# Patient Record
Sex: Female | Born: 1975
Health system: Southern US, Community
[De-identification: ages and names within clinical notes are randomized; demographics above are authoritative.]

## PROBLEM LIST (undated history)

## (undated) DIAGNOSIS — R638 Other symptoms and signs concerning food and fluid intake: Secondary | ICD-10-CM

## (undated) DIAGNOSIS — E785 Hyperlipidemia, unspecified: Secondary | ICD-10-CM

## (undated) DIAGNOSIS — F53 Postpartum depression: Secondary | ICD-10-CM

## (undated) DIAGNOSIS — I10 Essential (primary) hypertension: Secondary | ICD-10-CM

## (undated) DIAGNOSIS — N952 Postmenopausal atrophic vaginitis: Secondary | ICD-10-CM

## (undated) DIAGNOSIS — O99345 Other mental disorders complicating the puerperium: Secondary | ICD-10-CM

## (undated) DIAGNOSIS — O139 Gestational [pregnancy-induced] hypertension without significant proteinuria, unspecified trimester: Secondary | ICD-10-CM

## (undated) HISTORY — DX: Postpartum depression: F53.0

## (undated) HISTORY — DX: Essential (primary) hypertension: I10

## (undated) HISTORY — DX: Postmenopausal atrophic vaginitis: N95.2

## (undated) HISTORY — PX: CHOLECYSTECTOMY: SHX55

## (undated) HISTORY — DX: Other symptoms and signs concerning food and fluid intake: R63.8

## (undated) HISTORY — PX: WISDOM TOOTH EXTRACTION: SHX21

## (undated) HISTORY — DX: Hyperlipidemia, unspecified: E78.5

## (undated) HISTORY — DX: Gestational (pregnancy-induced) hypertension without significant proteinuria, unspecified trimester: O13.9

## (undated) HISTORY — DX: Other mental disorders complicating the puerperium: O99.345

---

## 1996-05-19 HISTORY — PX: ROTATOR CUFF REPAIR: SHX139

## 2000-12-04 ENCOUNTER — Other Ambulatory Visit: Admission: RE | Admit: 2000-12-04 | Discharge: 2000-12-04 | Payer: Self-pay | Admitting: Obstetrics and Gynecology

## 2005-12-30 ENCOUNTER — Encounter: Admission: RE | Admit: 2005-12-30 | Discharge: 2005-12-30 | Payer: Self-pay | Admitting: Gastroenterology

## 2006-01-22 ENCOUNTER — Encounter (INDEPENDENT_AMBULATORY_CARE_PROVIDER_SITE_OTHER): Payer: Self-pay | Admitting: Specialist

## 2006-01-22 ENCOUNTER — Ambulatory Visit (HOSPITAL_COMMUNITY): Admission: RE | Admit: 2006-01-22 | Discharge: 2006-01-22 | Payer: Self-pay | Admitting: General Surgery

## 2009-10-02 ENCOUNTER — Inpatient Hospital Stay (HOSPITAL_COMMUNITY): Admission: RE | Admit: 2009-10-02 | Discharge: 2009-10-05 | Payer: Self-pay | Admitting: Obstetrics and Gynecology

## 2009-10-05 ENCOUNTER — Encounter: Admission: RE | Admit: 2009-10-05 | Discharge: 2009-11-04 | Payer: Self-pay | Admitting: Obstetrics and Gynecology

## 2009-10-10 ENCOUNTER — Emergency Department (HOSPITAL_COMMUNITY): Admission: EM | Admit: 2009-10-10 | Discharge: 2009-10-11 | Payer: Self-pay | Admitting: Emergency Medicine

## 2009-11-05 ENCOUNTER — Encounter: Admission: RE | Admit: 2009-11-05 | Discharge: 2009-11-08 | Payer: Self-pay | Admitting: Obstetrics and Gynecology

## 2010-08-05 LAB — CBC
HCT: 36.5 % (ref 36.0–46.0)
Hemoglobin: 12.4 g/dL (ref 12.0–15.0)
MCHC: 34 g/dL (ref 30.0–36.0)
MCHC: 34.9 g/dL (ref 30.0–36.0)
MCV: 91.4 fL (ref 78.0–100.0)
Platelets: 155 10*3/uL (ref 150–400)
RBC: 2.67 MIL/uL — ABNORMAL LOW (ref 3.87–5.11)
RBC: 3.53 MIL/uL — ABNORMAL LOW (ref 3.87–5.11)
RDW: 14.5 % (ref 11.5–15.5)
RDW: 14.9 % (ref 11.5–15.5)
WBC: 9 10*3/uL (ref 4.0–10.5)

## 2010-08-05 LAB — COMPREHENSIVE METABOLIC PANEL
ALT: 12 U/L (ref 0–35)
Alkaline Phosphatase: 132 U/L — ABNORMAL HIGH (ref 39–117)
BUN: 6 mg/dL (ref 6–23)
CO2: 18 mEq/L — ABNORMAL LOW (ref 19–32)
Calcium: 9.1 mg/dL (ref 8.4–10.5)
GFR calc non Af Amer: >60 mL/min (ref >60)
Glucose, Bld: 105 mg/dL — ABNORMAL HIGH (ref 70–99)
Sodium: 136 mEq/L (ref 135–145)

## 2010-08-05 LAB — DIFFERENTIAL
Lymphocytes Relative: 19 % (ref 12–46)
Lymphs Abs: 1.7 10*3/uL (ref 0.7–4.0)
Monocytes Relative: 6 % (ref 3–12)
Neutro Abs: 6.5 10*3/uL (ref 1.7–7.7)
Neutrophils Relative %: 73 % (ref 43–77)

## 2010-08-05 LAB — LACTATE DEHYDROGENASE: LDH: 178 U/L (ref 94–250)

## 2010-08-05 LAB — RAPID URINE DRUG SCREEN, HOSP PERFORMED
Amphetamines: NOT DETECTED
Benzodiazepines: NOT DETECTED
Cocaine: NOT DETECTED
Opiates: NOT DETECTED
Tetrahydrocannabinol: NOT DETECTED

## 2010-08-05 LAB — BASIC METABOLIC PANEL
Calcium: 9.1 mg/dL (ref 8.4–10.5)
Creatinine, Ser: 0.68 mg/dL (ref 0.4–1.2)
GFR calc Af Amer: >60 mL/min (ref >60)
Sodium: 142 mEq/L (ref 135–145)

## 2010-08-05 LAB — ETHANOL: Alcohol, Ethyl (B): <5 mg/dL (ref 0–10)

## 2010-08-05 LAB — URIC ACID: Uric Acid, Serum: 4.9 mg/dL (ref 2.4–7.0)

## 2010-10-04 NOTE — Op Note (Signed)
Courtney Ali, Courtney Ali            ACCOUNT NO.:  [id number]   MEDICAL RECORD NO.:  [id number]          PATIENT TYPE:  AMB   LOCATION:  SDS                          FACILITY:  [facility]   PHYSICIAN:  Cherylynn Ridges, M.D.    DATE OF BIRTH:  May 02, 1976   DATE OF PROCEDURE:  01/22/2006  DATE OF DISCHARGE:                                 OPERATIVE REPORT   PREOPERATIVE DIAGNOSIS:  Symptomatic cholelithiasis.   POSTOPERATIVE DIAGNOSIS:  Symptomatic cholelithiasis.   PROCEDURE:  Laparoscopic cholecystectomy with cholangiogram.   SURGEON:  Cherylynn Ridges, M.D.  No assistant.   ANESTHESIA:  General endotracheal.   ESTIMATED BLOOD LOSS:  Less than 20 mL.   COMPLICATIONS:  None.   CONDITION:  Stable.   INDICATIONS FOR OPERATION:  The patient is a 35 year old with abdominal pain  in the right upper quadrant and gallstones noted on ultrasound, who comes in  for a laparoscopic cholecystectomy.   FINDINGS:  The patient had some evidence of past inflammation with some  omental adhesions to the gallbladder.  Cholangiogram was normal with small  ducts and no evidence of obstruction.  She had multiple stones in the  gallbladder.   OPERATION:  The patient was taken to the operating room and placed on the  table in supine position.  After an adequate endotracheal anesthetic was  administered, she was prepped and draped in the usual sterile manner  exposing the midline and the right upper quadrant.   An infraumbilical longitudinal incision made using a #11 blade and taken  down to the midline fascia.  This fascia was grasped with a Kocher clamp and  then an incision made between two Kochers into the fascia and preperitoneal  space.  We bluntly dissected down through the peritoneum into the peritoneal  cavity using a Kelly clamp while pulling up on the Kocher clamps.  Once we  had entered the free peritoneal space, a pursestring suture of 0 Vicryl was  passed around the fascial opening to secure  the Hasson cannula that was  passed subsequently into the peritoneal cavity.  Once the Hasson cannula was  secured in place, carbon dioxide gas was instilled through the Hasson  cannula into the peritoneal cavity up to maximal intra-abdominal pressure of  15 mmHg.   The patient was placed reversed Trendelenburg.  Two right costal margin 5-mm  cannulas were passed under direct vision and a subxiphoid 11/12 mm cannula  passed under direct vision.  With the left side tilted down in reversed  Trendelenburg, the dissection was begun.   The patient had some omental adhesions to the body of the gallbladder, which  were bluntly dissected away.  The gallbladder itself was somewhat floppy.  We were able to retract it toward the right upper quadrant and the right-  sided abdominal wall with a ratcheted grasper through the lateral-most 5-mm  cannula.  A second grasper was passed onto the infundibulum.  We able to  dissect out the peritoneum overlying the triangle of Calot and the  hepatoduodenal triangle with minimal difficulty.  The cystic artery ran the  external to the triangle  of Calot along with the cystic duct, and we able to  cauterize it as it was a very small structure.  We dissected out the cystic  duct and got an adequate window between the cystic duct and the common bile  duct.  A clip was placed on the gallbladder side of the cystic duct and then  a cholecystodochotomy made using laparoscopic scissors.  It was through this  cholecystodochotomy that a Cook catheter was passed and a cholangiogram  done, which showed good flow into the duodenum, no obstruction, no filling  defects, and good proximal flow.  A copy of the cholangiogram was left on  the patient's chart.   Once the cholangiogram was completed, we removed the clip securing the  catheter in place, removed the [facility] catheter and then doubly clipped this  the distal to the cystic duct using the Endoclose.  We then transected the   cystic duct and then dissected out the gallbladder from its bed with minimal  difficulty.  We used an EndoCatch bag to pull the gallbladder out from the  infraumbilical site with minimal difficulty.  All counts were correct.  We  irrigated with a small amount of saline solution, obtained adequate  hemostasis using electrocautery, and then closed.   The pursestring suture in the infraumbilical position was used to close the  fascial incision at that side and then the skin was closed at all sites  using running subcuticular stitch of 4-0 Vicryl.  Marcaine 0.25% with  epinephrine was injected prior to closure of skin and then sterile dressings  applied.  All counts were correct as mentioned previously.      Cherylynn Ridges, M.D.  Electronically Signed     JOW/MEDQ  D:  01/22/2006  T:  01/22/2006  Job:  [id number]

## 2012-04-18 HISTORY — PX: OTHER SURGICAL HISTORY: SHX169

## 2012-08-21 ENCOUNTER — Emergency Department: Payer: Self-pay | Admitting: Emergency Medicine

## 2012-08-21 LAB — URINALYSIS, COMPLETE
Bilirubin,UR: NEGATIVE
Hyaline Cast: 1
Nitrite: NEGATIVE
Specific Gravity: 1.013 (ref 1.003–1.030)
WBC UR: 12 /HPF (ref 0–5)

## 2012-08-21 LAB — BASIC METABOLIC PANEL
Anion Gap: 6 — ABNORMAL LOW (ref 7–16)
BUN: 5 mg/dL — ABNORMAL LOW (ref 7–18)
Calcium, Total: 8.8 mg/dL (ref 8.5–10.1)
Chloride: 106 mmol/L (ref 98–107)
Co2: 25 mmol/L (ref 21–32)
Creatinine: 0.54 mg/dL — ABNORMAL LOW (ref 0.60–1.30)
EGFR (African American): >60
EGFR (Non-African Amer.): >60
Sodium: 137 mmol/L (ref 136–145)

## 2012-08-21 LAB — CBC
HCT: 36.9 % (ref 35.0–47.0)
MCV: 87 fL (ref 80–100)
Platelet: 237 10*3/uL (ref 150–440)
WBC: 11.4 10*3/uL — ABNORMAL HIGH (ref 3.6–11.0)

## 2012-12-06 ENCOUNTER — Observation Stay: Payer: Self-pay | Admitting: Obstetrics and Gynecology

## 2012-12-09 ENCOUNTER — Observation Stay: Payer: Self-pay | Admitting: Obstetrics and Gynecology

## 2012-12-13 ENCOUNTER — Observation Stay: Payer: Self-pay | Admitting: Obstetrics and Gynecology

## 2012-12-20 ENCOUNTER — Observation Stay: Payer: Self-pay | Admitting: Obstetrics and Gynecology

## 2012-12-23 ENCOUNTER — Observation Stay: Payer: Self-pay | Admitting: Obstetrics and Gynecology

## 2012-12-27 ENCOUNTER — Observation Stay: Payer: Self-pay | Admitting: Obstetrics and Gynecology

## 2012-12-30 ENCOUNTER — Observation Stay: Payer: Self-pay | Admitting: Obstetrics and Gynecology

## 2013-01-03 ENCOUNTER — Observation Stay: Payer: Self-pay | Admitting: Obstetrics and Gynecology

## 2013-01-06 ENCOUNTER — Inpatient Hospital Stay: Payer: Self-pay | Admitting: Obstetrics and Gynecology

## 2013-01-06 LAB — PROTEIN / CREATININE RATIO, URINE
Creatinine, Urine: 193.2 mg/dL — ABNORMAL HIGH (ref 30.0–125.0)
Protein, Random Urine: 59 mg/dL — ABNORMAL HIGH (ref 0–12)

## 2013-01-06 LAB — PIH PROFILE
Anion Gap: 4 — ABNORMAL LOW (ref 7–16)
BUN: 6 mg/dL — ABNORMAL LOW (ref 7–18)
Calcium, Total: 8.6 mg/dL (ref 8.5–10.1)
Co2: 26 mmol/L (ref 21–32)
Creatinine: 0.5 mg/dL — ABNORMAL LOW (ref 0.60–1.30)
HCT: 30.5 % — ABNORMAL LOW (ref 35.0–47.0)
HGB: 10.4 g/dL — ABNORMAL LOW (ref 12.0–16.0)
MCH: 28.8 pg (ref 26.0–34.0)
MCHC: 34.2 g/dL (ref 32.0–36.0)
RDW: 14.5 % (ref 11.5–14.5)
Sodium: 139 mmol/L (ref 136–145)
Uric Acid: 4.1 mg/dL (ref 2.6–6.0)
WBC: 7.5 10*3/uL (ref 3.6–11.0)

## 2013-01-07 LAB — PIH PROFILE
BUN: 5 mg/dL — ABNORMAL LOW (ref 7–18)
Calcium, Total: 7.7 mg/dL — ABNORMAL LOW (ref 8.5–10.1)
Co2: 26 mmol/L (ref 21–32)
Creatinine: 0.6 mg/dL (ref 0.60–1.30)
EGFR (African American): >60
HGB: 10.4 g/dL — ABNORMAL LOW (ref 12.0–16.0)
Potassium: 3.9 mmol/L (ref 3.5–5.1)
RDW: 14.7 % — ABNORMAL HIGH (ref 11.5–14.5)
SGOT(AST): 18 U/L (ref 15–37)
Uric Acid: 3.9 mg/dL (ref 2.6–6.0)

## 2013-01-07 LAB — HEMATOCRIT: HCT: 29.6 % — ABNORMAL LOW (ref 35.0–47.0)

## 2013-01-26 LAB — PATHOLOGY REPORT

## 2013-08-23 LAB — HM PAP SMEAR: HM PAP: NEGATIVE

## 2014-09-26 NOTE — H&P (Signed)
L&D Evaluation:  History:  HPI 39 yo mwf G2P1001 at 36.2 weeks admitted with Mild Pre Eclampsia in Di/DI Vtx/Br Twin gestation.   Presents with Mild Pre Eclampsia   Patient's Medical History No Chronic Illness  PP Depression; UTI in Pregnancy; Anemia   Patient's Surgical History none   Medications Pre Natal Vitamins  Iron   Allergies NKDA   Social History none   Family History Non-Contributory   ROS:  ROS All systems were reviewed.  HEENT, CNS, GI, GU, Respiratory, CV, Renal and Musculoskeletal systems were found to be normal.   Exam:  Vital Signs BP >140/90   Urine Protein 1+   General no apparent distress   Mental Status clear   Heart normal sinus rhythm   Abdomen gravid, non-tender   Estimated Fetal Weight Average for gestational age   Edema 1+   Reflexes 3+   Clonus negative   Pelvic 3/80/-2/vtx/bowi   Mebranes Intact   FHT normal rate with no decels   Skin dry   Lymph no lymphadenopathy   Other O=/Atb-/NR/RI/VI/HB-/HIV-/GBS- Urine P:C ratio 305; Platelets 143k   Impression:  Impression 36.2 week Twins; Mild Pre Eclampsia   Plan:  Plan Pitocin IOL; Magnesium prophylaxis; Epidural   Comments Anticipate Vaginal delivery.   Electronic Signatures: Tarrence Enck, Prentice DockerMartin A (MD)  (Signed 21-Aug-14 13:50)  Authored: L&D Evaluation   Last Updated: 21-Aug-14 13:50 by Derik Fults, Prentice DockerMartin A (MD)

## 2014-09-26 NOTE — H&P (Signed)
L&D Evaluation:  History Expanded:  HPI late entry99- 39 yo at 3833 weeks for NST of DI/DI twins, AMA, INcreased BMI h/o fetal macrosomia 9#4oz   Gravida 2   Term 1   PreTerm 0   Abortion 0   Living 1   Blood Type (Maternal) O positive   Group B Strep Results Maternal (Result >5wks must be treated as unknown) unknown/result > 5 weeks ago   Maternal HIV Negative   Maternal Syphilis Ab Nonreactive   Maternal Varicella Immune   Rubella Results (Maternal) immune   Maternal T-Dap Unknown   [facility]   Presents with TWINS for NST   Patient's Medical History AMA. DI DI Twins, Increasaed BMI   Patient's Surgical History none   Medications Pre Natal Vitamins   Allergies NKDA   Social History none   Family History Non-Contributory   Current Prenatal Course Notable For Multiple Gestation   ROS:  ROS All systems were reviewed.  HEENT, CNS, GI, GU, Respiratory, CV, Renal and Musculoskeletal systems were found to be normal.   Exam:  Vital Signs stable   Urine Protein not completed   General no apparent distress   Mental Status clear   Chest clear   Heart normal sinus rhythm   Abdomen gravid, non-tender   Estimated Fetal Weight Large for gestational age   Back no CVAT   Pelvic no external lesions   Mebranes Intact   Other both twins strictly reactive no decels, cat 1   Impression:  Impression reactive NST   Plan:  Comments follow up weekly. labopr precautions given   Electronic Signatures: Adria DevonKlett, Anselm Aumiller (MD)  (Signed 30-Jul-14 21:22)  Authored: L&D Evaluation   Last Updated: 30-Jul-14 21:22 by Adria DevonKlett, Laakea Pereira (MD)

## 2014-12-18 ENCOUNTER — Telehealth: Payer: Self-pay

## 2014-12-18 DIAGNOSIS — E7800 Pure hypercholesterolemia, unspecified: Secondary | ICD-10-CM

## 2014-12-18 NOTE — Telephone Encounter (Signed)
Pt notified of need of 6 month f/u Lipid panel and she should be fasting.

## 2014-12-18 NOTE — Telephone Encounter (Signed)
-----   Message from Myrtis Hopping sent at 11/06/2014  5:25 PM EDT ----- Regarding: labs Sent: 12/18/2014  Historical Summary: 08/21/2014  Reminder for patient: Courtney Ali  Per mad pt needs to repeat lipid panel in 6 months. cm

## 2014-12-25 NOTE — Telephone Encounter (Addendum)
Reminder for Lipid panel needed to be done. (Chart previously opened)

## 2014-12-25 NOTE — Telephone Encounter (Signed)
Left message to remind pt to have Lipid panel done.

## 2014-12-26 ENCOUNTER — Other Ambulatory Visit: Payer: 59

## 2014-12-26 DIAGNOSIS — E7800 Pure hypercholesterolemia, unspecified: Secondary | ICD-10-CM

## 2014-12-27 LAB — LIPID PANEL
CHOLESTEROL TOTAL: 165 mg/dL (ref 100–199)
Chol/HDL Ratio: 3.6 ratio units (ref 0.0–4.4)
HDL: 46 mg/dL (ref >39)
LDL CALC: 96 mg/dL (ref 0–99)
Triglycerides: 115 mg/dL (ref 0–149)
VLDL CHOLESTEROL CAL: 23 mg/dL (ref 5–40)

## 2015-03-27 ENCOUNTER — Encounter: Payer: 59 | Admitting: Obstetrics and Gynecology

## 2015-03-28 ENCOUNTER — Encounter: Payer: Self-pay | Admitting: Obstetrics and Gynecology

## 2015-03-28 ENCOUNTER — Ambulatory Visit (INDEPENDENT_AMBULATORY_CARE_PROVIDER_SITE_OTHER): Payer: 59 | Admitting: Obstetrics and Gynecology

## 2015-03-28 VITALS — BP 142/82 | HR 69 | Ht 68.6 in | Wt 214.0 lb

## 2015-03-28 DIAGNOSIS — E785 Hyperlipidemia, unspecified: Secondary | ICD-10-CM | POA: Diagnosis not present

## 2015-03-28 DIAGNOSIS — R638 Other symptoms and signs concerning food and fluid intake: Secondary | ICD-10-CM

## 2015-03-28 DIAGNOSIS — Z01419 Encounter for gynecological examination (general) (routine) without abnormal findings: Secondary | ICD-10-CM

## 2015-03-28 DIAGNOSIS — E7800 Pure hypercholesterolemia, unspecified: Secondary | ICD-10-CM | POA: Diagnosis not present

## 2015-03-28 DIAGNOSIS — Z8759 Personal history of other complications of pregnancy, childbirth and the puerperium: Secondary | ICD-10-CM | POA: Diagnosis not present

## 2015-03-28 DIAGNOSIS — F419 Anxiety disorder, unspecified: Secondary | ICD-10-CM

## 2015-03-28 DIAGNOSIS — N644 Mastodynia: Secondary | ICD-10-CM

## 2015-03-28 NOTE — Addendum Note (Signed)
Addended by: Sharon SellerEFRANCESCO, MARTIN on: 03/28/2015 11:34 AM   Modules accepted: Orders

## 2015-03-28 NOTE — Patient Instructions (Signed)
1.  Pap smear obtained. 2.  Diagnostic mammogram is ordered for evaluation of left breast pain, upper outer quadrant. 3.  Continue with healthy eating and exercise.  Periodically check blood pressure. 4.  Screening labs will be deferred until next year. 5.  Mirena IUD string check is normal. 6.  Return in 1 year.

## 2015-03-28 NOTE — Progress Notes (Signed)
Patient ID: Courtney Ali, female   DOB: 12-Jan-1976, 39 y.o.   MRN: [id number] ANNUAL PREVENTATIVE CARE GYN  ENCOUNTER NOTE  Subjective:       Courtney Ali is a 39 y.o. 352P2003 female here for a routine annual gynecologic exam.  Current complaints: 1.  Left breast pain on uoq near cycle  Recent cholesterol evaluation notable for total cholesterol of 165.  Patient is doing cross fit 3 times a week and rowing twice a week.  She feels as if a void as been filled in her day-to-day life.  Relationship is good with husband.  Children doing well.   Gynecologic History Patient's last menstrual period was 01/18/2015 (approximate). Contraception: IUD Last Pap: 08/23/2014 reflex- neg. Results were: abnormal Last mammogram: n/a. Results were: n/a  Obstetric History OB History  Gravida Para Term Preterm AB SAB TAB Ectopic Multiple Living  2 2 2      1 3     # Outcome Date GA Lbr Len/2nd Weight Sex Delivery Anes PTL Lv  2A Term     F Vag-Spont   Y  2B Term     F Vag-Spont   Y  1 Term    9 lb 9.6 oz (4.355 kg) F Vag-Spont   Y      Past Medical History  Diagnosis Date  . Hypertension   . PIH (pregnancy induced hypertension)   . Increased BMI   . Post partum depression   . Vaginal atrophy     breastfeeding induced    Past Surgical History  Procedure Laterality Date  . Artificial inseminaiton  04/2012    Current Outpatient Prescriptions on File Prior to Visit  Medication Sig Dispense Refill  . levonorgestrel (MIRENA) 20 MCG/24HR IUD 1 each by Intrauterine route once.     No current facility-administered medications on file prior to visit.    No Known Allergies  Social History   Social History  . Marital Status: Married    Spouse Name: N/A  . Number of Children: N/A  . Years of Education: N/A   Occupational History  . Not on file.   Social History Main Topics  . Smoking status: Never Smoker   . Smokeless tobacco: Not on file  . Alcohol Use: Yes     Comment:  socially  . Drug Use: No  . Sexual Activity: Yes    Birth Control/ Protection: IUD   Other Topics Concern  . Not on file   Social History Narrative    Family History  Problem Relation Age of Onset  . Diabetes Paternal Grandmother   . Heart disease Neg Hx   . Breast cancer Neg Hx   . Colon cancer Neg Hx   . Ovarian cancer Neg Hx   . Cancer Neg Hx     The following portions of the patient's history were reviewed and updated as appropriate: allergies, current medications, past family history, past medical history, past social history, past surgical history and problem list.  Review of Systems ROS Review of Systems - General ROS: negative for - chills, fatigue, fever, hot flashes, night sweats, weight gain or weight loss Psychological ROS: negative for - anxiety, decreased libido, depression, mood swings, physical abuse or sexual abuse Ophthalmic ROS: negative for - blurry vision, eye pain or loss of vision ENT ROS: negative for - headaches, hearing change, visual changes or vocal changes Allergy and Immunology ROS: negative for - hives, itchy/watery eyes or seasonal allergies Hematological and Lymphatic ROS: negative for -  bleeding problems, bruising, swollen lymph nodes or weight loss Endocrine ROS: negative for - galactorrhea, hair pattern changes, hot flashes, malaise/lethargy, mood swings, palpitations, polydipsia/polyuria, skin changes, temperature intolerance or unexpected weight changes Breast ROS: negative for - new or changing breast lumps or nipple discharge; POSITIVE-left breast, upper outer quadrant Respiratory ROS: negative for - cough or shortness of breath Cardiovascular ROS: negative for - chest pain, irregular heartbeat, palpitations or shortness of breath Gastrointestinal ROS: no abdominal pain, change in bowel habits, or black or bloody stools Genito-Urinary ROS: no dysuria, trouble voiding, or hematuria Musculoskeletal ROS: negative for - joint pain or joint  stiffness Neurological ROS: negative for - bowel and bladder control changes Dermatological ROS: negative for rash and skin lesion changes   Objective:   BP 142/82 mmHg  Pulse 69  Ht 5' 8.6" (1.742 m)  Wt 214 lb (97.07 kg)  BMI 31.99 kg/m2  LMP 01/18/2015 (Approximate) CONSTITUTIONAL: Well-developed, well-nourished female in no acute distress.  PSYCHIATRIC: Normal mood and affect. Normal behavior. Normal judgment and thought content. NEUROLGIC: Alert and oriented to person, place, and time. Normal muscle tone coordination. No cranial nerve deficit noted. HENT:  Normocephalic, atraumatic, External right and left ear normal. Oropharynx is clear and moist EYES: Conjunctivae and EOM are normal. Pupils are equal, round, and reactive to light. No scleral icterus.  NECK: Normal range of motion, supple, no masses.  Normal thyroid.  SKIN: Skin is warm and dry. No rash noted. Not diaphoretic. No erythema. No pallor. CARDIOVASCULAR: Normal heart rate noted, regular rhythm, no murmur. RESPIRATORY: Clear to auscultation bilaterally. Effort and breath sounds normal, no problems with respiration noted. BREASTS: Symmetric in size. No masses, skin changes, nipple drainage, or lymphadenopathy.no palpable abnormality in left breast. ABDOMEN: Soft, normal bowel sounds, no distention noted.  No tenderness, rebound or guarding.  BLADDER: Normal PELVIC:  External Genitalia: Normal  BUS: Normal  Vagina: Normal  Cervix: Normal; IUD string 3 cm visualized  Uterus: Normal; midplane, normal size and shape, mobile  Adnexa: Normal  RV: External Exam NormaI  MUSCULOSKELETAL: Normal range of motion. No tenderness.  No cyanosis, clubbing, or edema.  2+ distal pulses. LYMPHATIC: No Axillary, Supraclavicular, or Inguinal Adenopathy.    Assessment:   Annual gynecologic examination 39 y.o. Contraception: IUD Obesity 1; excellent fitness at this time with cross fit and rowing wellness program Left breast pain,  unclear etiology; fibrocystic change On exam History of hyperlipidemia; most recent cholesterol 165  Plan:  Pap: Pap Co Test Mammogram: diagnostic mammogram is ordered Stool Guaiac Testing:  Not Indicated Labs: fbs vit d lipid a1c  Routine preventative health maintenance measures emphasized: Exercise/Diet/Weight control, Tobacco Warnings, Alcohol/Substance use risks and Stress Management  Return to Clinic - 1 Year   Crystal Webster, CMA  Herold Harms, MD  Note: This dictation was prepared with Dragon dictation along with smaller phrase technology. Any transcriptional errors that result from this process are unintentional.

## 2015-04-02 LAB — PAP IG AND HPV HIGH-RISK: PAP SMEAR COMMENT: 0

## 2016-03-31 NOTE — Progress Notes (Signed)
Patient ID: Courtney Ali, female   DOB: Nov 06, 1975, 40 y.o.   MRN: [id number] ANNUAL PREVENTATIVE CARE GYN  ENCOUNTER NOTE  Subjective:       Courtney Ali is a 40 y.o. 722P2003 female here for a routine annual gynecologic exam.  Current complaints:  1. None  Patient has only spotting occasionally with Mirena IUD. No new interval health history issues. Bowel and bladder function are normal.    Gynecologic History No LMP recorded. Patient is not currently having periods (Reason: IUD). Contraception: IUD Last Pap: 03/2015 neg/neg. Results were: wnl Last mammogram: n/a. Results were: n/a  Obstetric History OB History  Gravida Para Term Preterm AB Living  2 2 2     3   SAB TAB Ectopic Multiple Live Births        1 3    # Outcome Date GA Lbr Len/2nd Weight Sex Delivery Anes PTL Lv  2A Term     F Vag-Spont   LIV  2B Term     F Vag-Spont   LIV  1 Term    9 lb 9.6 oz (4.355 kg) F Vag-Spont   LIV      Past Medical History:  Diagnosis Date  . Hypertension   . Increased BMI   . PIH (pregnancy induced hypertension)   . Post partum depression   . Vaginal atrophy    breastfeeding induced    Past Surgical History:  Procedure Laterality Date  . artificial inseminaiton  04/2012    Current Outpatient Prescriptions on File Prior to Visit  Medication Sig Dispense Refill  . levonorgestrel (MIRENA) 20 MCG/24HR IUD 1 each by Intrauterine route once.     No current facility-administered medications on file prior to visit.     No Known Allergies  Social History   Social History  . Marital status: Married    Spouse name: N/A  . Number of children: N/A  . Years of education: N/A   Occupational History  . Not on file.   Social History Main Topics  . Smoking status: Never Smoker  . Smokeless tobacco: Not on file  . Alcohol use Yes     Comment: socially  . Drug use: No  . Sexual activity: Yes    Birth control/ protection: IUD   Other Topics Concern  . Not on  file   Social History Narrative  . No narrative on file    Family History  Problem Relation Age of Onset  . Diabetes Paternal Grandmother   . Heart disease Neg Hx   . Breast cancer Neg Hx   . Colon cancer Neg Hx   . Ovarian cancer Neg Hx   . Cancer Neg Hx     The following portions of the patient's history were reviewed and updated as appropriate: allergies, current medications, past family history, past medical history, past social history, past surgical history and problem list.  Review of Systems ROS Review of Systems - General ROS: negative for - chills, fatigue, fever, hot flashes, night sweats, weight gain or weight loss Psychological ROS: negative for - anxiety, decreased libido, depression, mood swings, physical abuse or sexual abuse Ophthalmic ROS: negative for - blurry vision, eye pain or loss of vision ENT ROS: negative for - headaches, hearing change, visual changes or vocal changes Allergy and Immunology ROS: negative for - hives, itchy/watery eyes or seasonal allergies Hematological and Lymphatic ROS: negative for - bleeding problems, bruising, swollen lymph nodes or weight loss Endocrine ROS: negative for -  galactorrhea, hair pattern changes, hot flashes, malaise/lethargy, mood swings, palpitations, polydipsia/polyuria, skin changes, temperature intolerance or unexpected weight changes Breast ROS: negative for - new or changing breast lumps or nipple discharge; POSITIVE-left breast, upper outer quadrant Respiratory ROS: negative for - cough or shortness of breath Cardiovascular ROS: negative for - chest pain, irregular heartbeat, palpitations or shortness of breath Gastrointestinal ROS: no abdominal pain, change in bowel habits, or black or bloody stools Genito-Urinary ROS: no dysuria, trouble voiding, or hematuria Musculoskeletal ROS: negative for - joint pain or joint stiffness Neurological ROS: negative for - bowel and bladder control changes Dermatological ROS:  negative for rash and skin lesion changes   Objective:   BP 132/78   Pulse 71   Ht 5\' 8"  (1.727 m)   Wt 211 lb (95.7 kg)   BMI 32.08 kg/m  CONSTITUTIONAL: Well-developed, well-nourished female in no acute distress.  PSYCHIATRIC: Normal mood and affect. Normal behavior. Normal judgment and thought content. NEUROLGIC: Alert and oriented to person, place, and time. Normal muscle tone coordination. No cranial nerve deficit noted. HENT:  Normocephalic, atraumatic, External right and left ear normal. Oropharynx is clear and moist EYES: Conjunctivae and EOM are normal. Pupils are equal, round, and reactive to light. No scleral icterus.  NECK: Normal range of motion, supple, no masses.  Normal thyroid.  SKIN: Skin is warm and dry. No rash noted. Not diaphoretic. No erythema. No pallor. CARDIOVASCULAR: Normal heart rate noted, regular rhythm, no murmur. RESPIRATORY: Clear to auscultation bilaterally. Effort and breath sounds normal, no problems with respiration noted. BREASTS: Symmetric in size. No masses, skin changes, nipple drainage, or lymphadenopathy.no palpable abnormality in left breast. ABDOMEN: Soft, normal bowel sounds, no distention noted.  No tenderness, rebound or guarding.  BLADDER: Normal PELVIC:  External Genitalia: Normal  BUS: Normal  Vagina: Normal  Cervix: Normal; IUD string 3 cm visualized  Uterus: Normal; midplane, normal size and shape, mobile  Adnexa: Normal  RV: External Exam NormaI  MUSCULOSKELETAL: Normal range of motion. No tenderness.  No cyanosis, clubbing, or edema.  2+ distal pulses. LYMPHATIC: No Axillary, Supraclavicular, or Inguinal Adenopathy.    Assessment:   Annual gynecologic examination 40 y.o. Contraception: IUD Obesity 1; excellent fitness at this time with cross fit and rowing wellness program  Plan: Pap- due 2019 Mammogram: Due age 40 Stool Guaiac Testing:  Not Indicated Labs: thru husbands employer Routine preventative health  maintenance measures emphasized: Exercise/Diet/Weight control, Tobacco Warnings, Alcohol/Substance use risks and Stress Management  Return to Clinic - 1 Year   Crystal Twin OaksMiller, CMA  Herold HarmsMartin A Iven Earnhart, MD   Note: This dictation was prepared with Dragon dictation along with smaller phrase technology. Any transcriptional errors that result from this process are unintentional.

## 2016-04-01 ENCOUNTER — Ambulatory Visit (INDEPENDENT_AMBULATORY_CARE_PROVIDER_SITE_OTHER): Payer: 59 | Admitting: Obstetrics and Gynecology

## 2016-04-01 ENCOUNTER — Encounter: Payer: Self-pay | Admitting: Obstetrics and Gynecology

## 2016-04-01 VITALS — BP 132/78 | HR 71 | Ht 68.0 in | Wt 211.0 lb

## 2016-04-01 DIAGNOSIS — R638 Other symptoms and signs concerning food and fluid intake: Secondary | ICD-10-CM

## 2016-04-01 DIAGNOSIS — Z8759 Personal history of other complications of pregnancy, childbirth and the puerperium: Secondary | ICD-10-CM

## 2016-04-01 DIAGNOSIS — F419 Anxiety disorder, unspecified: Secondary | ICD-10-CM | POA: Diagnosis not present

## 2016-04-01 DIAGNOSIS — E7800 Pure hypercholesterolemia, unspecified: Secondary | ICD-10-CM | POA: Diagnosis not present

## 2016-04-01 DIAGNOSIS — Z01419 Encounter for gynecological examination (general) (routine) without abnormal findings: Secondary | ICD-10-CM | POA: Diagnosis not present

## 2016-04-01 NOTE — Patient Instructions (Signed)

## 2016-07-11 DIAGNOSIS — Z13220 Encounter for screening for lipoid disorders: Secondary | ICD-10-CM | POA: Diagnosis not present

## 2016-07-11 DIAGNOSIS — Z6832 Body mass index (BMI) 32.0-32.9, adult: Secondary | ICD-10-CM | POA: Diagnosis not present

## 2016-07-11 DIAGNOSIS — Z136 Encounter for screening for cardiovascular disorders: Secondary | ICD-10-CM | POA: Diagnosis not present

## 2016-07-11 DIAGNOSIS — Z713 Dietary counseling and surveillance: Secondary | ICD-10-CM | POA: Diagnosis not present

## 2017-03-27 NOTE — Progress Notes (Signed)
Patient ID: Courtney Missrica G Rundle, female   DOB: 30-Oct-1975, 41 y.o.   MRN: [id number] ANNUAL PREVENTATIVE CARE GYN  ENCOUNTER NOTE  Subjective:       Courtney Ali is a 41 y.o. 822P2003 female here for a routine annual gynecologic exam.  Current complaints:  1. Cramps- pelvic pain- 2 months in a row- doesn't last long  Patient has only spotting occasionally with Mirena IUD. No new interval health history issues. Bowel and bladder function are normal.  Just got back to working out after a break for 2 months.  She is doing the "burn" program-high intensity 3 times a week.  Patient is aware that she needs to lose weight which she had gained over the past year-17 pounds.    Gynecologic History No LMP recorded. Patient is not currently having periods (Reason: IUD). Contraception: IUD (inserted 02/17/2013) Last Pap: 03/2015 neg/neg. Results were: wnl Last mammogram: n/a. Results were: n/a  Obstetric History OB History  Gravida Para Term Preterm AB Living  2 2 2     3   SAB TAB Ectopic Multiple Live Births        1 3    # Outcome Date GA Lbr Len/2nd Weight Sex Delivery Anes PTL Lv  2A Term     F Vag-Spont   LIV  2B Term     F Vag-Spont   LIV  1 Term    9 lb 9.6 oz (4.355 kg) F Vag-Spont   LIV      Past Medical History:  Diagnosis Date  . Hypertension   . Increased BMI   . PIH (pregnancy induced hypertension)   . Post partum depression   . Vaginal atrophy    breastfeeding induced    Past Surgical History:  Procedure Laterality Date  . artificial inseminaiton  04/2012    Current Outpatient Medications on File Prior to Visit  Medication Sig Dispense Refill  . levonorgestrel (MIRENA) 20 MCG/24HR IUD 1 each by Intrauterine route once.     No current facility-administered medications on file prior to visit.     No Known Allergies  Social History   Socioeconomic History  . Marital status: Married    Spouse name: Not on file  . Number of children: Not on file  . Years  of education: Not on file  . Highest education level: Not on file  Social Needs  . Financial resource strain: Not on file  . Food insecurity - worry: Not on file  . Food insecurity - inability: Not on file  . Transportation needs - medical: Not on file  . Transportation needs - non-medical: Not on file  Occupational History  . Not on file  Tobacco Use  . Smoking status: Never Smoker  . Smokeless tobacco: Never Used  Substance and Sexual Activity  . Alcohol use: Yes    Comment: socially  . Drug use: No  . Sexual activity: Yes    Birth control/protection: IUD  Other Topics Concern  . Not on file  Social History Narrative  . Not on file    Family History  Problem Relation Age of Onset  . Diabetes Paternal Grandmother   . Heart disease Neg Hx   . Breast cancer Neg Hx   . Colon cancer Neg Hx   . Ovarian cancer Neg Hx   . Cancer Neg Hx     The following portions of the patient's history were reviewed and updated as appropriate: allergies, current medications, past family history, past medical  history, past social history, past surgical history and problem list.  Review of Systems Review of Systems  Constitutional: Negative.        Weight gain  HENT: Negative.   Eyes: Negative.   Respiratory: Negative.   Cardiovascular: Negative.   Gastrointestinal: Negative.   Genitourinary: Negative.   Musculoskeletal: Negative.   Skin: Negative.   Neurological: Negative.   Endo/Heme/Allergies: Negative.   Psychiatric/Behavioral: Negative.     Objective:   BP (!) 147/84   Pulse 92   Ht 5\' 8"  (1.727 m)   Wt 227 lb 12.8 oz (103.3 kg)   LMP  (LMP Unknown)   BMI 34.64 kg/m  CONSTITUTIONAL: Well-developed, well-nourished female in no acute distress.  PSYCHIATRIC: Normal mood and affect. Normal behavior. Normal judgment and thought content. NEUROLGIC: Alert and oriented to person, place, and time. Normal muscle tone coordination. No cranial nerve deficit noted. HENT:   Normocephalic, atraumatic, External right and left ear normal. Oropharynx is clear and moist EYES: Conjunctivae and EOM are normal.  No scleral icterus.  NECK: Normal range of motion, supple, no masses.  Normal thyroid.  SKIN: Skin is warm and dry. No rash noted. Not diaphoretic. No erythema. No pallor. CARDIOVASCULAR: Normal heart rate noted, regular rhythm, no murmur. RESPIRATORY: Clear to auscultation bilaterally. Effort and breath sounds normal, no problems with respiration noted. BREASTS: Symmetric in size. No masses, skin changes, nipple drainage, or lymphadenopathy.no palpable abnormality in left breast. ABDOMEN: Soft, normal bowel sounds, no distention noted.  No tenderness, rebound or guarding.  BLADDER: Normal PELVIC:  External Genitalia: Normal  BUS: Normal  Vagina: Normal  Cervix: Normal; IUD string 2 cm visualized  Uterus: Normal; midplane, normal size and shape, mobile  Adnexa: Normal  RV: External Exam NormaI, No Rectal Masses and Normal Sphincter tone  MUSCULOSKELETAL: Normal range of motion. No tenderness.  No cyanosis, clubbing, or edema.  2+ distal pulses. LYMPHATIC: No Axillary, Supraclavicular, or Inguinal Adenopathy.    Assessment:   Annual gynecologic examination 41 y.o. Contraception: IUD Obesity 1   wellness program  Plan: Pap- due 2019 Mammogram: ordered Stool Guaiac Testing:  Not Indicated Labs: thru husbands employer Routine preventative health maintenance measures emphasized: Exercise/Diet/Weight control, Tobacco Warnings, Alcohol/Substance use risks and Stress Management  Weight loss goal this year-17 pounds Return to Clinic - 1 Year  [place] ValleyMiller, CMA  Herold HarmsMartin A Jasmen Emrich, MD  Note: This dictation was prepared with Dragon dictation along with smaller phrase technology. Any transcriptional errors that result from this process are unintentional.

## 2017-04-02 ENCOUNTER — Ambulatory Visit (INDEPENDENT_AMBULATORY_CARE_PROVIDER_SITE_OTHER): Payer: BLUE CROSS/BLUE SHIELD | Admitting: Obstetrics and Gynecology

## 2017-04-02 ENCOUNTER — Encounter: Payer: Self-pay | Admitting: Obstetrics and Gynecology

## 2017-04-02 VITALS — BP 147/84 | HR 92 | Ht 68.0 in | Wt 227.8 lb

## 2017-04-02 DIAGNOSIS — F419 Anxiety disorder, unspecified: Secondary | ICD-10-CM | POA: Diagnosis not present

## 2017-04-02 DIAGNOSIS — Z1239 Encounter for other screening for malignant neoplasm of breast: Secondary | ICD-10-CM

## 2017-04-02 DIAGNOSIS — R638 Other symptoms and signs concerning food and fluid intake: Secondary | ICD-10-CM | POA: Diagnosis not present

## 2017-04-02 DIAGNOSIS — Z1231 Encounter for screening mammogram for malignant neoplasm of breast: Secondary | ICD-10-CM

## 2017-04-02 DIAGNOSIS — Z30431 Encounter for routine checking of intrauterine contraceptive device: Secondary | ICD-10-CM | POA: Diagnosis not present

## 2017-04-02 DIAGNOSIS — Z01419 Encounter for gynecological examination (general) (routine) without abnormal findings: Secondary | ICD-10-CM | POA: Diagnosis not present

## 2017-04-02 NOTE — Patient Instructions (Signed)
1.  No Pap smear done.  Next Pap smear is due in 2019 2.  Mammogram is ordered 3.  Screening labs are obtained through employer 4.  Continue with healthy eating, exercise, and control weight loss.  Goal for weight loss this year is 17 pounds 5.  Contraception-Mirena IUD 6.  Return in 1 year for annual exam 7.  Periodically monitor blood pressure and let us know if blood pressure trend reveals 140/90 or greater   Health Maintenance, Female Adopting a healthy lifestyle and getting preventive care can go a long way to promote health and wellness. Talk with your health care provider about what schedule of regular examinations is right for you. This is a good chance for you to check in with your provider about disease prevention and staying healthy. In between checkups, there are plenty of things you can do on your own. Experts have done a lot of research about which lifestyle changes and preventive measures are most likely to keep you healthy. Ask your health care provider for more information. Weight and diet Eat a healthy diet  Be sure to include plenty of vegetables, fruits, low-fat dairy products, and lean protein.  Do not eat a lot of foods high in solid fats, added sugars, or salt.  Get regular exercise. This is one of the most important things you can do for your health. ? Most adults should exercise for at least 150 minutes each week. The exercise should increase your heart rate and make you sweat (moderate-intensity exercise). ? Most adults should also do strengthening exercises at least twice a week. This is in addition to the moderate-intensity exercise.  Maintain a healthy weight  Body mass index (BMI) is a measurement that can be used to identify possible weight problems. It estimates body fat based on height and weight. Your health care provider can help determine your BMI and help you achieve or maintain a healthy weight.  For females 60 years of age and older: ? A BMI below  18.5 is considered underweight. ? A BMI of 18.5 to 24.9 is normal. ? A BMI of 25 to 29.9 is considered overweight. ? A BMI of 30 and above is considered obese.  Watch levels of cholesterol and blood lipids  You should start having your blood tested for lipids and cholesterol at 40 years of age, then have this test every 5 years.  You may need to have your cholesterol levels checked more often if: ? Your lipid or cholesterol levels are high. ? You are older than 40 years of age. ? You are at high risk for heart disease.  Cancer screening Lung Cancer  Lung cancer screening is recommended for adults 65-29 years old who are at high risk for lung cancer because of a history of smoking.  A yearly low-dose CT scan of the lungs is recommended for people who: ? Currently smoke. ? Have quit within the past 15 years. ? Have at least a 30-pack-year history of smoking. A pack year is smoking an average of one pack of cigarettes a day for 1 year.  Yearly screening should continue until it has been 15 years since you quit.  Yearly screening should stop if you develop a health problem that would prevent you from having lung cancer treatment.  Breast Cancer  Practice breast self-awareness. This means understanding how your breasts normally appear and feel.  It also means doing regular breast self-exams. Let your health care provider know about any changes, no matter  how small.  If you are in your 20s or 30s, you should have a clinical breast exam (CBE) by a health care provider every 1-3 years as part of a regular health exam.  If you are 38 or older, have a CBE every year. Also consider having a breast X-ray (mammogram) every year.  If you have a family history of breast cancer, talk to your health care provider about genetic screening.  If you are at high risk for breast cancer, talk to your health care provider about having an MRI and a mammogram every year.  Breast cancer gene (BRCA)  assessment is recommended for women who have family members with BRCA-related cancers. BRCA-related cancers include: ? Breast. ? Ovarian. ? Tubal. ? Peritoneal cancers.  Results of the assessment will determine the need for genetic counseling and BRCA1 and BRCA2 testing.  Cervical Cancer Your health care provider may recommend that you be screened regularly for cancer of the pelvic organs (ovaries, uterus, and vagina). This screening involves a pelvic examination, including checking for microscopic changes to the surface of your cervix (Pap test). You may be encouraged to have this screening done every 3 years, beginning at age 67.  For women ages 36-65, health care providers may recommend pelvic exams and Pap testing every 3 years, or they may recommend the Pap and pelvic exam, combined with testing for human papilloma virus (HPV), every 5 years. Some types of HPV increase your risk of cervical cancer. Testing for HPV may also be done on women of any age with unclear Pap test results.  Other health care providers may not recommend any screening for nonpregnant women who are considered low risk for pelvic cancer and who do not have symptoms. Ask your health care provider if a screening pelvic exam is right for you.  If you have had past treatment for cervical cancer or a condition that could lead to cancer, you need Pap tests and screening for cancer for at least 20 years after your treatment. If Pap tests have been discontinued, your risk factors (such as having a new sexual partner) need to be reassessed to determine if screening should resume. Some women have medical problems that increase the chance of getting cervical cancer. In these cases, your health care provider may recommend more frequent screening and Pap tests.  Colorectal Cancer  This type of cancer can be detected and often prevented.  Routine colorectal cancer screening usually begins at 40 years of age and continues through 40  years of age.  Your health care provider may recommend screening at an earlier age if you have risk factors for colon cancer.  Your health care provider may also recommend using home test kits to check for hidden blood in the stool.  A small camera at the end of a tube can be used to examine your colon directly (sigmoidoscopy or colonoscopy). This is done to check for the earliest forms of colorectal cancer.  Routine screening usually begins at age 40.  Direct examination of the colon should be repeated every 5-10 years through 40 years of age. However, you may need to be screened more often if early forms of precancerous polyps or small growths are found.  Skin Cancer  Check your skin from head to toe regularly.  Tell your health care provider about any new moles or changes in moles, especially if there is a change in a mole's shape or color.  Also tell your health care provider if you have a  mole that is larger than the size of a pencil eraser.  Always use sunscreen. Apply sunscreen liberally and repeatedly throughout the day.  Protect yourself by wearing long sleeves, pants, a wide-brimmed hat, and sunglasses whenever you are outside.  Heart disease, diabetes, and high blood pressure  High blood pressure causes heart disease and increases the risk of stroke. High blood pressure is more likely to develop in: ? People who have blood pressure in the high end of the normal range (130-139/85-89 mm Hg). ? People who are overweight or obese. ? People who are African American.  If you are 97-54 years of age, have your blood pressure checked every 3-5 years. If you are 9 years of age or older, have your blood pressure checked every year. You should have your blood pressure measured twice-once when you are at a hospital or clinic, and once when you are not at a hospital or clinic. Record the average of the two measurements. To check your blood pressure when you are not at a hospital or  clinic, you can use: ? An automated blood pressure machine at a pharmacy. ? A home blood pressure monitor.  If you are between 18 years and 33 years old, ask your health care provider if you should take aspirin to prevent strokes.  Have regular diabetes screenings. This involves taking a blood sample to check your fasting blood sugar level. ? If you are at a normal weight and have a low risk for diabetes, have this test once every three years after 40 years of age. ? If you are overweight and have a high risk for diabetes, consider being tested at a younger age or more often. Preventing infection Hepatitis B  If you have a higher risk for hepatitis B, you should be screened for this virus. You are considered at high risk for hepatitis B if: ? You were born in a country where hepatitis B is common. Ask your health care provider which countries are considered high risk. ? Your parents were born in a high-risk country, and you have not been immunized against hepatitis B (hepatitis B vaccine). ? You have HIV or AIDS. ? You use needles to inject street drugs. ? You live with someone who has hepatitis B. ? You have had sex with someone who has hepatitis B. ? You get hemodialysis treatment. ? You take certain medicines for conditions, including cancer, organ transplantation, and autoimmune conditions.  Hepatitis C  Blood testing is recommended for: ? Everyone born from [birth year] through 1964. ? Anyone with known risk factors for hepatitis C.  Sexually transmitted infections (STIs)  You should be screened for sexually transmitted infections (STIs) including gonorrhea and chlamydia if: ? You are sexually active and are younger than 40 years of age. ? You are older than 40 years of age and your health care provider tells you that you are at risk for this type of infection. ? Your sexual activity has changed since you were last screened and you are at an increased risk for chlamydia or gonorrhea.  Ask your health care provider if you are at risk.  If you do not have HIV, but are at risk, it may be recommended that you take a prescription medicine daily to prevent HIV infection. This is called pre-exposure prophylaxis (PrEP). You are considered at risk if: ? You are sexually active and do not regularly use condoms or know the HIV status of your partner(s). ? You take drugs by injection. ? You  are sexually active with a partner who has HIV.  Talk with your health care provider about whether you are at high risk of being infected with HIV. If you choose to begin PrEP, you should first be tested for HIV. You should then be tested every 3 months for as long as you are taking PrEP. Pregnancy  If you are premenopausal and you may become pregnant, ask your health care provider about preconception counseling.  If you may become pregnant, take 400 to 800 micrograms (mcg) of folic acid every day.  If you want to prevent pregnancy, talk to your health care provider about birth control (contraception). Osteoporosis and menopause  Osteoporosis is a disease in which the bones lose minerals and strength with aging. This can result in serious bone fractures. Your risk for osteoporosis can be identified using a bone density scan.  If you are 95 years of age or older, or if you are at risk for osteoporosis and fractures, ask your health care provider if you should be screened.  Ask your health care provider whether you should take a calcium or vitamin D supplement to lower your risk for osteoporosis.  Menopause may have certain physical symptoms and risks.  Hormone replacement therapy may reduce some of these symptoms and risks. Talk to your health care provider about whether hormone replacement therapy is right for you. Follow these instructions at home:  Schedule regular health, dental, and eye exams.  Stay current with your immunizations.  Do not use any tobacco products including cigarettes,  chewing tobacco, or electronic cigarettes.  If you are pregnant, do not drink alcohol.  If you are breastfeeding, limit how much and how often you drink alcohol.  Limit alcohol intake to no more than 1 drink per day for nonpregnant women. One drink equals 12 ounces of beer, 5 ounces of wine, or 1 ounces of hard liquor.  Do not use street drugs.  Do not share needles.  Ask your health care provider for help if you need support or information about quitting drugs.  Tell your health care provider if you often feel depressed.  Tell your health care provider if you have ever been abused or do not feel safe at home. This information is not intended to replace advice given to you by your health care provider. Make sure you discuss any questions you have with your health care provider. Document Released: 11/18/2010 Document Revised: 10/11/2015 Document Reviewed: 02/06/2015 Elsevier Interactive Patient Education  [employer].

## 2017-04-23 ENCOUNTER — Ambulatory Visit
Admission: RE | Admit: 2017-04-23 | Discharge: 2017-04-23 | Disposition: A | Discharge status: Home / Self Care | Payer: BLUE CROSS/BLUE SHIELD | Source: Ambulatory Visit | Attending: Obstetrics and Gynecology | Admitting: Obstetrics and Gynecology

## 2017-04-23 DIAGNOSIS — Z1231 Encounter for screening mammogram for malignant neoplasm of breast: Secondary | ICD-10-CM | POA: Diagnosis not present

## 2017-04-23 DIAGNOSIS — Z1239 Encounter for other screening for malignant neoplasm of breast: Secondary | ICD-10-CM

## 2017-04-28 ENCOUNTER — Other Ambulatory Visit: Payer: Self-pay | Admitting: Obstetrics and Gynecology

## 2017-04-28 ENCOUNTER — Telehealth: Payer: Self-pay | Admitting: Obstetrics and Gynecology

## 2017-04-28 DIAGNOSIS — R928 Other abnormal and inconclusive findings on diagnostic imaging of breast: Secondary | ICD-10-CM

## 2017-04-28 DIAGNOSIS — N6489 Other specified disorders of breast: Secondary | ICD-10-CM

## 2017-04-28 NOTE — Telephone Encounter (Signed)
Pt aware birad 0 needs additional views. Per pt appt made for 04/30/2017.

## 2017-04-28 NOTE — Telephone Encounter (Signed)
Lmtrc

## 2017-04-28 NOTE — Telephone Encounter (Signed)
Pt request a call back to get her mammogram results. Please advise. Thanks TNP

## 2017-04-30 ENCOUNTER — Ambulatory Visit
Admission: RE | Admit: 2017-04-30 | Discharge: 2017-04-30 | Disposition: A | Discharge status: Home / Self Care | Payer: BLUE CROSS/BLUE SHIELD | Source: Ambulatory Visit | Attending: Obstetrics and Gynecology | Admitting: Obstetrics and Gynecology

## 2017-04-30 DIAGNOSIS — R928 Other abnormal and inconclusive findings on diagnostic imaging of breast: Secondary | ICD-10-CM | POA: Insufficient documentation

## 2017-04-30 DIAGNOSIS — N6489 Other specified disorders of breast: Secondary | ICD-10-CM

## 2017-04-30 DIAGNOSIS — R922 Inconclusive mammogram: Secondary | ICD-10-CM | POA: Diagnosis not present

## 2017-07-14 DIAGNOSIS — Z13220 Encounter for screening for lipoid disorders: Secondary | ICD-10-CM | POA: Diagnosis not present

## 2017-07-14 DIAGNOSIS — Z136 Encounter for screening for cardiovascular disorders: Secondary | ICD-10-CM | POA: Diagnosis not present

## 2017-07-14 DIAGNOSIS — Z713 Dietary counseling and surveillance: Secondary | ICD-10-CM | POA: Diagnosis not present

## 2017-07-14 DIAGNOSIS — Z6832 Body mass index (BMI) 32.0-32.9, adult: Secondary | ICD-10-CM | POA: Diagnosis not present

## 2017-10-30 ENCOUNTER — Telehealth: Payer: Self-pay | Admitting: Obstetrics and Gynecology

## 2017-10-30 DIAGNOSIS — N6489 Other specified disorders of breast: Secondary | ICD-10-CM

## 2017-10-30 NOTE — Telephone Encounter (Signed)
Pt aware dx left mammo and left u/s ordered placed. Pt aware mad will be back in office on Wed to sign order.

## 2017-10-30 NOTE — Telephone Encounter (Signed)
The patient stated that she needs [facility] put in [facility] Please advise.

## 2017-10-30 NOTE — Addendum Note (Signed)
Addended by: Marchelle FolksMILLER, Jonathen Rathman G on: 10/30/2017 04:40 PM   Modules accepted: Orders

## 2017-11-26 ENCOUNTER — Ambulatory Visit
Admission: RE | Admit: 2017-11-26 | Discharge: 2017-11-26 | Disposition: A | Discharge status: Home / Self Care | Payer: BLUE CROSS/BLUE SHIELD | Source: Ambulatory Visit | Attending: Obstetrics and Gynecology | Admitting: Obstetrics and Gynecology

## 2017-11-26 DIAGNOSIS — N6489 Other specified disorders of breast: Secondary | ICD-10-CM

## 2017-11-26 DIAGNOSIS — R928 Other abnormal and inconclusive findings on diagnostic imaging of breast: Secondary | ICD-10-CM | POA: Diagnosis not present

## 2018-04-05 NOTE — Progress Notes (Signed)
Patient ID: Courtney Ali, female   DOB: 07/16/1975, 42 y.o.   MRN: [id number] ANNUAL PREVENTATIVE CARE GYN  ENCOUNTER NOTE  Subjective:       Courtney Ali is a 42 y.o. G72P2003 female here for a routine annual gynecologic exam.  Current complaints:  1. Patient is having some dizzy spells that comes and goes. This has been happening for the last month. She notices more when she is not working out. Patient does have some nausea with some of the spells.  Patient does not report any significant palpitations.  She reports no loss of consciousness.  She has had physical blood work done through her husband's work; she will obtain these results for our review.  Patient has only spotting occasionally with Mirena IUD.  Mirena IUD needs to be replaced this year. No new interval health history issues. Bowel and bladder function are normal.     Gynecologic History No LMP recorded. (Menstrual status: IUD). Contraception: IUD (inserted 02/17/2013) Last Pap: 03/2015 neg/neg Last mammogram: n/a  Obstetric History OB History  Gravida Para Term Preterm AB Living  2 2 2     3   SAB TAB Ectopic Multiple Live Births        1 3    # Outcome Date GA Lbr Len/2nd Weight Sex Delivery Anes PTL Lv  2A Term     F Vag-Spont   LIV  2B Term     F Vag-Spont   LIV  1 Term    9 lb 9.6 oz (4.355 kg) F Vag-Spont   LIV    Past Medical History:  Diagnosis Date  . Hypertension   . Increased BMI   . PIH (pregnancy induced hypertension)   . Post partum depression   . Vaginal atrophy    breastfeeding induced    Past Surgical History:  Procedure Laterality Date  . artificial inseminaiton  04/2012    Current Outpatient Medications on File Prior to Visit  Medication Sig Dispense Refill  . levonorgestrel (MIRENA) 20 MCG/24HR IUD 1 each by Intrauterine route once.     No current facility-administered medications on file prior to visit.     No Known Allergies  Social History   Socioeconomic History   . Marital status: Married    Spouse name: Not on file  . Number of children: Not on file  . Years of education: Not on file  . Highest education level: Not on file  Occupational History  . Not on file  Social Needs  . Financial resource strain: Not on file  . Food insecurity:    Worry: Not on file    Inability: Not on file  . Transportation needs:    Medical: Not on file    Non-medical: Not on file  Tobacco Use  . Smoking status: Never Smoker  . Smokeless tobacco: Never Used  Substance and Sexual Activity  . Alcohol use: Yes    Comment: socially  . Drug use: No  . Sexual activity: Yes    Birth control/protection: IUD  Lifestyle  . Physical activity:    Days per week: 3 days    Minutes per session: 60 min  . Stress: Not on file  Relationships  . Social connections:    Talks on phone: Not on file    Gets together: Not on file    Attends religious service: Not on file    Active member of club or organization: Not on file    Attends meetings of clubs  or organizations: Not on file    Relationship status: Not on file  . Intimate partner violence:    Fear of current or ex partner: No    Emotionally abused: No    Physically abused: No    Forced sexual activity: No  Other Topics Concern  . Not on file  Social History Narrative  . Not on file    Family History  Problem Relation Age of Onset  . Diabetes Paternal Grandmother   . Heart disease Neg Hx   . Breast cancer Neg Hx   . Colon cancer Neg Hx   . Ovarian cancer Neg Hx   . Cancer Neg Hx     The following portions of the patient's history were reviewed and updated as appropriate: allergies, current medications, past family history, past medical history, past social history, past surgical history and problem list.  Review of Systems Review of Systems  Constitutional: Negative.   Respiratory: Negative for cough and shortness of breath.   Cardiovascular: Negative.  Negative for chest pain, palpitations and  orthopnea.  Gastrointestinal: Negative for abdominal pain, diarrhea, nausea and vomiting.  Genitourinary: Negative.   Musculoskeletal: Negative.   Skin: Negative.   Neurological: Positive for dizziness. Negative for tingling, loss of consciousness and headaches.  Psychiatric/Behavioral: The patient is nervous/anxious.   All other systems reviewed and are negative.    Objective:   BP (!) 152/96   Ht 5\' 8"  (1.727 m)   Wt 228 lb 1.6 oz (103.5 kg)   LMP 03/30/2018   BMI 34.68 kg/m  CONSTITUTIONAL: Well-developed, well-nourished female in no acute distress.  PSYCHIATRIC: Normal mood and affect. Normal behavior. Normal judgment and thought content. NEUROLGIC: Alert and oriented to person, place, and time. Normal muscle tone coordination. No cranial nerve deficit noted. HENT:  Normocephalic, atraumatic, External right and left ear normal.  EYES: Conjunctivae and EOM are normal.  No scleral icterus.  NECK: Normal range of motion, supple, no masses.  Normal thyroid.  SKIN: Skin is warm and dry. No rash noted. Not diaphoretic. No erythema. No pallor. CARDIOVASCULAR: Normal heart rate noted, regular rhythm, no murmur. RESPIRATORY: Clear to auscultation bilaterally. Effort and breath sounds normal, no problems with respiration noted. BREASTS: Symmetric in size. No masses, skin changes, nipple drainage, or lymphadenopathy.no palpable abnormality in left breast.  Left nipple has 7 mm papilloma. ABDOMEN: Soft, no distention noted.  No tenderness, rebound or guarding.  BLADDER: Normal PELVIC:  External Genitalia: Normal  BUS: Normal  Vagina: Normal  Cervix: Normal; IUD string 2 cm visualized  Uterus: Normal; midplane, normal size and shape, mobile  Adnexa: Normal  RV: External Exam NormaI, No Rectal Masses and Normal Sphincter tone  MUSCULOSKELETAL: Normal range of motion. No tenderness.  No cyanosis, clubbing, or edema.  2+ distal pulses. LYMPHATIC: No Axillary, Supraclavicular, or Inguinal  Adenopathy.    Assessment:   Annual gynecologic examination 42 y.o. Contraception: IUD Obesity 1  Wellness program Dizziness and nausea episodes, unclear etiology Elevated blood pressure without a diagnosis of hypertension  Plan: Pap- ordered Mammogram: Due Jan 2020, ordered Stool Guaiac Testing:  Not Indicated Labs: thru husbands employer Routine preventative health maintenance measures emphasized: Exercise/Diet/Weight control, Tobacco Warnings, Alcohol/Substance use risks and Stress Management  Blood pressure diary over the next 2 weeks Review screening labs done through husband's insurance Return in 2 weeks for Mirena IUD insertion ENT referral for evaluation of dizziness and nausea-1 month duration Return to Clinic - 1 Year  Dorian PodJamie J Wheeley, CMA  Britt Bottom CMA acting as scribe for Dr. Greggory Keen. I have reviewed, updated, and concur with the information scribed.   Note: This dictation was prepared with Dragon dictation along with smaller phrase technology. Any transcriptional errors that result from this process are unintentional.

## 2018-04-06 ENCOUNTER — Ambulatory Visit (INDEPENDENT_AMBULATORY_CARE_PROVIDER_SITE_OTHER): Payer: BLUE CROSS/BLUE SHIELD | Admitting: Obstetrics and Gynecology

## 2018-04-06 ENCOUNTER — Encounter: Payer: Self-pay | Admitting: Obstetrics and Gynecology

## 2018-04-06 ENCOUNTER — Other Ambulatory Visit (HOSPITAL_COMMUNITY)
Admission: RE | Admit: 2018-04-06 | Discharge: 2018-04-06 | Disposition: A | Discharge status: Home / Self Care | Payer: BLUE CROSS/BLUE SHIELD | Source: Ambulatory Visit | Attending: Obstetrics and Gynecology | Admitting: Obstetrics and Gynecology

## 2018-04-06 VITALS — BP 152/96 | Ht 68.0 in | Wt 228.1 lb

## 2018-04-06 DIAGNOSIS — Z124 Encounter for screening for malignant neoplasm of cervix: Secondary | ICD-10-CM | POA: Diagnosis not present

## 2018-04-06 DIAGNOSIS — Z1239 Encounter for other screening for malignant neoplasm of breast: Secondary | ICD-10-CM

## 2018-04-06 DIAGNOSIS — F419 Anxiety disorder, unspecified: Secondary | ICD-10-CM

## 2018-04-06 DIAGNOSIS — Z01419 Encounter for gynecological examination (general) (routine) without abnormal findings: Secondary | ICD-10-CM

## 2018-04-06 DIAGNOSIS — R030 Elevated blood-pressure reading, without diagnosis of hypertension: Secondary | ICD-10-CM

## 2018-04-06 DIAGNOSIS — Z87898 Personal history of other specified conditions: Secondary | ICD-10-CM

## 2018-04-06 DIAGNOSIS — Z30431 Encounter for routine checking of intrauterine contraceptive device: Secondary | ICD-10-CM | POA: Diagnosis not present

## 2018-04-06 DIAGNOSIS — R638 Other symptoms and signs concerning food and fluid intake: Secondary | ICD-10-CM

## 2018-04-06 NOTE — Patient Instructions (Addendum)
1.  Pap smear is done. 2.  Mammogram is ordered. 3.  Maintain blood pressure diary over the next 2 weeks 4.  Return in 2 weeks for follow-up on blood pressure 5.  Return in 2 weeks for Mirena removal and Mirena reinsertion 6.  Continue with healthy eating and exercise 7.  Referral to ENT for evaluation of dizziness with associated nausea-chronic x1 month 8.  Return in 1 year for annual exam-Dr. [place] Maintenance, Female Adopting a healthy lifestyle and getting preventive care can go a long way to promote health and wellness. Talk with your health care provider about what schedule of regular examinations is right for you. This is a good chance for you to check in with your provider about disease prevention and staying healthy. In between checkups, there are plenty of things you can do on your own. Experts have done a lot of research about which lifestyle changes and preventive measures are most likely to keep you healthy. Ask your health care provider for more information. Weight and diet Eat a healthy diet  Be sure to include plenty of vegetables, fruits, low-fat dairy products, and lean protein.  Do not eat a lot of foods high in solid fats, added sugars, or salt.  Get regular exercise. This is one of the most important things you can do for your health. ? Most adults should exercise for at least 150 minutes each week. The exercise should increase your heart rate and make you sweat (moderate-intensity exercise). ? Most adults should also do strengthening exercises at least twice a week. This is in addition to the moderate-intensity exercise.  Maintain a healthy weight  Body mass index (BMI) is a measurement that can be used to identify possible weight problems. It estimates body fat based on height and weight. Your health care provider can help determine your BMI and help you achieve or maintain a healthy weight.  For females 22 years of age and older: ? A BMI below 18.5 is  considered underweight. ? A BMI of 18.5 to 24.9 is normal. ? A BMI of 25 to 29.9 is considered overweight. ? A BMI of 30 and above is considered obese.  Watch levels of cholesterol and blood lipids  You should start having your blood tested for lipids and cholesterol at 41 years of age, then have this test every 5 years.  You may need to have your cholesterol levels checked more often if: ? Your lipid or cholesterol levels are high. ? You are older than 41 years of age. ? You are at high risk for heart disease.  Cancer screening Lung Cancer  Lung cancer screening is recommended for adults 65-63 years old who are at high risk for lung cancer because of a history of smoking.  A yearly low-dose CT scan of the lungs is recommended for people who: ? Currently smoke. ? Have quit within the past 15 years. ? Have at least a 30-pack-year history of smoking. A pack year is smoking an average of one pack of cigarettes a day for 1 year.  Yearly screening should continue until it has been 15 years since you quit.  Yearly screening should stop if you develop a health problem that would prevent you from having lung cancer treatment.  Breast Cancer  Practice breast self-awareness. This means understanding how your breasts normally appear and feel.  It also means doing regular breast self-exams. Let your health care provider know about any changes, no matter how small.  If you are in your 20s or 30s, you should have a clinical breast exam (CBE) by a health care provider every 1-3 years as part of a regular health exam.  If you are 64 or older, have a CBE every year. Also consider having a breast X-ray (mammogram) every year.  If you have a family history of breast cancer, talk to your health care provider about genetic screening.  If you are at high risk for breast cancer, talk to your health care provider about having an MRI and a mammogram every year.  Breast cancer gene (BRCA) assessment  is recommended for women who have family members with BRCA-related cancers. BRCA-related cancers include: ? Breast. ? Ovarian. ? Tubal. ? Peritoneal cancers.  Results of the assessment will determine the need for genetic counseling and BRCA1 and BRCA2 testing.  Cervical Cancer Your health care provider may recommend that you be screened regularly for cancer of the pelvic organs (ovaries, uterus, and vagina). This screening involves a pelvic examination, including checking for microscopic changes to the surface of your cervix (Pap test). You may be encouraged to have this screening done every 3 years, beginning at age 72.  For women ages 56-65, health care providers may recommend pelvic exams and Pap testing every 3 years, or they may recommend the Pap and pelvic exam, combined with testing for human papilloma virus (HPV), every 5 years. Some types of HPV increase your risk of cervical cancer. Testing for HPV may also be done on women of any age with unclear Pap test results.  Other health care providers may not recommend any screening for nonpregnant women who are considered low risk for pelvic cancer and who do not have symptoms. Ask your health care provider if a screening pelvic exam is right for you.  If you have had past treatment for cervical cancer or a condition that could lead to cancer, you need Pap tests and screening for cancer for at least 20 years after your treatment. If Pap tests have been discontinued, your risk factors (such as having a new sexual partner) need to be reassessed to determine if screening should resume. Some women have medical problems that increase the chance of getting cervical cancer. In these cases, your health care provider may recommend more frequent screening and Pap tests.  Colorectal Cancer  This type of cancer can be detected and often prevented.  Routine colorectal cancer screening usually begins at 41 years of age and continues through 41 years of  age.  Your health care provider may recommend screening at an earlier age if you have risk factors for colon cancer.  Your health care provider may also recommend using home test kits to check for hidden blood in the stool.  A small camera at the end of a tube can be used to examine your colon directly (sigmoidoscopy or colonoscopy). This is done to check for the earliest forms of colorectal cancer.  Routine screening usually begins at age 38.  Direct examination of the colon should be repeated every 5-10 years through 41 years of age. However, you may need to be screened more often if early forms of precancerous polyps or small growths are found.  Skin Cancer  Check your skin from head to toe regularly.  Tell your health care provider about any new moles or changes in moles, especially if there is a change in a mole's shape or color.  Also tell your health care provider if you have a mole that is  larger than the size of a pencil eraser.  Always use sunscreen. Apply sunscreen liberally and repeatedly throughout the day.  Protect yourself by wearing long sleeves, pants, a wide-brimmed hat, and sunglasses whenever you are outside.  Heart disease, diabetes, and high blood pressure  High blood pressure causes heart disease and increases the risk of stroke. High blood pressure is more likely to develop in: ? People who have blood pressure in the high end of the normal range (130-139/85-89 mm Hg). ? People who are overweight or obese. ? People who are African American.  If you are 18-39 years of age, have your blood pressure checked every 3-5 years. If you are 40 years of age or older, have your blood pressure checked every year. You should have your blood pressure measured twice-once when you are at a hospital or clinic, and once when you are not at a hospital or clinic. Record the average of the two measurements. To check your blood pressure when you are not at a hospital or clinic, you  can use: ? An automated blood pressure machine at a pharmacy. ? A home blood pressure monitor.  If you are between 55 years and 79 years old, ask your health care provider if you should take aspirin to prevent strokes.  Have regular diabetes screenings. This involves taking a blood sample to check your fasting blood sugar level. ? If you are at a normal weight and have a low risk for diabetes, have this test once every three years after 41 years of age. ? If you are overweight and have a high risk for diabetes, consider being tested at a younger age or more often. Preventing infection Hepatitis B  If you have a higher risk for hepatitis B, you should be screened for this virus. You are considered at high risk for hepatitis B if: ? You were born in a country where hepatitis B is common. Ask your health care provider which countries are considered high risk. ? Your parents were born in a high-risk country, and you have not been immunized against hepatitis B (hepatitis B vaccine). ? You have HIV or AIDS. ? You use needles to inject street drugs. ? You live with someone who has hepatitis B. ? You have had sex with someone who has hepatitis B. ? You get hemodialysis treatment. ? You take certain medicines for conditions, including cancer, organ transplantation, and autoimmune conditions.  Hepatitis C  Blood testing is recommended for: ? Everyone born from 1945 through 1965. ? Anyone with known risk factors for hepatitis C.  Sexually transmitted infections (STIs)  You should be screened for sexually transmitted infections (STIs) including gonorrhea and chlamydia if: ? You are sexually active and are younger than 41 years of age. ? You are older than 41 years of age and your health care provider tells you that you are at risk for this type of infection. ? Your sexual activity has changed since you were last screened and you are at an increased risk for chlamydia or gonorrhea. Ask your  health care provider if you are at risk.  If you do not have HIV, but are at risk, it may be recommended that you take a prescription medicine daily to prevent HIV infection. This is called pre-exposure prophylaxis (PrEP). You are considered at risk if: ? You are sexually active and do not regularly use condoms or know the HIV status of your partner(s). ? You take drugs by injection. ? You are sexually active   with a partner who has HIV.  Talk with your health care provider about whether you are at high risk of being infected with HIV. If you choose to begin PrEP, you should first be tested for HIV. You should then be tested every 3 months for as long as you are taking PrEP. Pregnancy  If you are premenopausal and you may become pregnant, ask your health care provider about preconception counseling.  If you may become pregnant, take 400 to 800 micrograms (mcg) of folic acid every day.  If you want to prevent pregnancy, talk to your health care provider about birth control (contraception). Osteoporosis and menopause  Osteoporosis is a disease in which the bones lose minerals and strength with aging. This can result in serious bone fractures. Your risk for osteoporosis can be identified using a bone density scan.  If you are 20 years of age or older, or if you are at risk for osteoporosis and fractures, ask your health care provider if you should be screened.  Ask your health care provider whether you should take a calcium or vitamin D supplement to lower your risk for osteoporosis.  Menopause may have certain physical symptoms and risks.  Hormone replacement therapy may reduce some of these symptoms and risks. Talk to your health care provider about whether hormone replacement therapy is right for you. Follow these instructions at home:  Schedule regular health, dental, and eye exams.  Stay current with your immunizations.  Do not use any tobacco products including cigarettes, chewing  tobacco, or electronic cigarettes.  If you are pregnant, do not drink alcohol.  If you are breastfeeding, limit how much and how often you drink alcohol.  Limit alcohol intake to no more than 1 drink per day for nonpregnant women. One drink equals 12 ounces of beer, 5 ounces of wine, or 1 ounces of hard liquor.  Do not use street drugs.  Do not share needles.  Ask your health care provider for help if you need support or information about quitting drugs.  Tell your health care provider if you often feel depressed.  Tell your health care provider if you have ever been abused or do not feel safe at home. This information is not intended to replace advice given to you by your health care provider. Make sure you discuss any questions you have with your health care provider. Document Released: 11/18/2010 Document Revised: 10/11/2015 Document Reviewed: 02/06/2015 Elsevier Interactive Patient Education  [employer].

## 2018-04-09 LAB — CYTOLOGY - PAP
DIAGNOSIS: NEGATIVE
HPV (WINDOPATH): NOT DETECTED

## 2018-04-21 ENCOUNTER — Ambulatory Visit (INDEPENDENT_AMBULATORY_CARE_PROVIDER_SITE_OTHER): Payer: BLUE CROSS/BLUE SHIELD | Admitting: Obstetrics and Gynecology

## 2018-04-21 ENCOUNTER — Encounter: Payer: Self-pay | Admitting: Obstetrics and Gynecology

## 2018-04-21 VITALS — BP 136/89 | HR 79 | Ht 68.0 in | Wt 226.3 lb

## 2018-04-21 DIAGNOSIS — R030 Elevated blood-pressure reading, without diagnosis of hypertension: Secondary | ICD-10-CM

## 2018-04-21 DIAGNOSIS — Z30433 Encounter for removal and reinsertion of intrauterine contraceptive device: Secondary | ICD-10-CM | POA: Diagnosis not present

## 2018-04-21 DIAGNOSIS — Z8249 Family history of ischemic heart disease and other diseases of the circulatory system: Secondary | ICD-10-CM

## 2018-04-21 NOTE — Patient Instructions (Addendum)
1.  Mirena IUD is removed today.  New Mirena IUD is inserted. 2.  Return in 4 weeks for IUD string check-Dr. Logan BoresEvans 3.  Continue with blood pressure diary. 4.  Referral is made to Dr. Darrick Huntsmanullo and PA for hypertension evaluation/management  IUD PLACEMENT POST-PROCEDURE INSTRUCTIONS  1. You may take Ibuprofen, Aleve or Tylenol for pain if needed.  Cramping should resolve within in 24 hours.  2. You may have a small amount of spotting.  You should wear a mini pad for the next few days.  3. You may have intercourse after 24 hours.  If you using this for birth control, it is effective immediately.  4. You need to call if you have any pelvic pain, fever, heavy bleeding or foul smelling vaginal discharge.  Irregular bleeding is common the first several months after having an IUD placed. You do not need to call for this reason unless you are concerned.  5. Shower or bathe as normal  6. You should have a follow-up appointment in 4-8 weeks for a re-check to make sure you are not having any problems.

## 2018-04-22 NOTE — Progress Notes (Signed)
Chief complaint: 1.  Mirena IUD removal and reinsertion  Patient presents for IUD exchange.  IUD Insertion Procedure Note  Pre-operative Diagnosis: Contraception  Post-operative Diagnosis: same  Indications: contraception  Procedure Details  Urine pregnancy test was not done.  The risks (including infection, bleeding, pain, and uterine perforation) and benefits of the procedure were explained to the patient and Verbal informed consent was obtained.  Pelvic exam demonstrated midplane uterus of normal size and shape.  The previously placed Mirena IUD strings are visualized and grasped with [place] forceps with gentle traction causing removal.  Cervix cleansed with Betadine. Uterus sounded to 8 cm. IUD inserted without difficulty. String visible and trimmed-3 cm in length. Patient tolerated procedure well.  IUD Information: Toney ReilMirena, Lot # [id number], Expiration date Jan 2022.  Condition: Stable  Complications: None  Plan:  The patient was advised to call for any fever or for prolonged or severe pain or bleeding. She was advised to use OTC acetaminophen and OTC ibuprofen as needed for mild to moderate pain.   Attending Physician Documentation: Herold HarmsMartin A Vashawn Ekstein, MD

## 2018-05-14 DIAGNOSIS — R42 Dizziness and giddiness: Secondary | ICD-10-CM | POA: Diagnosis not present

## 2018-05-20 ENCOUNTER — Encounter: Payer: BLUE CROSS/BLUE SHIELD | Admitting: Obstetrics and Gynecology

## 2018-05-24 DIAGNOSIS — H04123 Dry eye syndrome of bilateral lacrimal glands: Secondary | ICD-10-CM | POA: Diagnosis not present

## 2018-06-01 ENCOUNTER — Encounter: Payer: Self-pay | Admitting: Obstetrics and Gynecology

## 2018-06-01 ENCOUNTER — Ambulatory Visit (INDEPENDENT_AMBULATORY_CARE_PROVIDER_SITE_OTHER): Payer: BLUE CROSS/BLUE SHIELD | Admitting: Obstetrics and Gynecology

## 2018-06-01 VITALS — BP 148/105 | HR 80 | Ht 68.5 in | Wt 232.5 lb

## 2018-06-01 DIAGNOSIS — Z30431 Encounter for routine checking of intrauterine contraceptive device: Secondary | ICD-10-CM

## 2018-06-01 DIAGNOSIS — Z7689 Persons encountering health services in other specified circumstances: Secondary | ICD-10-CM

## 2018-06-01 DIAGNOSIS — I10 Essential (primary) hypertension: Secondary | ICD-10-CM

## 2018-06-01 NOTE — Progress Notes (Signed)
    GYNECOLOGY OFFICE ENCOUNTER NOTE  History:  43 y.o. G2P2003 here today for today for IUD string check; Mirena  IUD was placed  04/21/2018. No complaints about the IUD, no concerning side effects.  The following portions of the patient's history were reviewed and updated as appropriate: allergies, current medications, past family history, past medical history, past social history, past surgical history and problem list. Last pap smear on 04/06/2018 was normal, negative HRHPV.   Patient also notes that she was supposed to be referred to a PCP for management of her newly diagnosed HTN, however has not yet received an appointment.    Review of Systems:   Pertinent items are noted in HPI.   Objective:  Physical Exam Blood pressure (!) 148/105, pulse 80, height 5' 8.5" (1.74 m), weight 232 lb 8 oz (105.5 kg). CONSTITUTIONAL: Well-developed, well-nourished female in no acute distress.  ABDOMEN: Soft, no distention noted.   PELVIC: Normal appearing external genitalia; normal appearing vaginal mucosa and cervix.  IUD strings visualized after being retrieved from endocervical canal.   EXTREMITIES: extremities normal, atraumatic, no cyanosis or edema NEUROLOGIC: Grossly normal   Assessment & Plan:  Patient to keep IUD in place for up to 5 years; can come in for removal if she desires pregnancy earlier or for or concerning side effects. Referral placed for PCP to establish care and f/u HTN   Hildred Laser, MD [facility]

## 2018-06-01 NOTE — Progress Notes (Signed)
Pt stated that the IUD is working well for her. Pt stated that [facility] had referred her to a PCP but never heard from them. Pt is having issues with blood pressure being elevated.

## 2018-06-17 ENCOUNTER — Telehealth: Payer: Self-pay | Admitting: Obstetrics and Gynecology

## 2018-06-17 DIAGNOSIS — Z1239 Encounter for other screening for malignant neoplasm of breast: Secondary | ICD-10-CM

## 2018-06-17 NOTE — Telephone Encounter (Signed)
The patient called and stated that she needs orders placed in order for her to schedule her mammogram she has to schedule every 6 months. Please advise.

## 2018-06-18 NOTE — Telephone Encounter (Signed)
Pt called to see where she wanted to have her mammogram orders sent to. Orders were put in for pt's mammogram and sent to [facility].

## 2018-07-06 ENCOUNTER — Telehealth: Payer: Self-pay | Admitting: Obstetrics and Gynecology

## 2018-07-06 ENCOUNTER — Other Ambulatory Visit: Payer: Self-pay | Admitting: Obstetrics and Gynecology

## 2018-07-06 DIAGNOSIS — N6489 Other specified disorders of breast: Secondary | ICD-10-CM

## 2018-07-06 NOTE — Telephone Encounter (Signed)
Routed message to AC.

## 2018-07-06 NOTE — Telephone Encounter (Signed)
The patient called and stated that [place] informed her that her mammogram orders that were entered need to be signed of by Dr Valentino Saxon. Please advise.

## 2018-07-06 NOTE — Telephone Encounter (Signed)
Spoke with pt and stated that she called to make her appointment and the correct orders were no in. I informed the pt that someone had put in 2 new orders this morning at 11:30am Pt stated that she would call the mammogram place to see if those new orders were correct. Pt stated that she would call back if they were not.

## 2018-07-06 NOTE — Telephone Encounter (Signed)
Patient needs mammo order changed to diagnostic bilateral so she can schedule.Thanks

## 2018-07-06 NOTE — Telephone Encounter (Signed)
I did earlier today 

## 2018-07-23 ENCOUNTER — Ambulatory Visit
Admission: RE | Admit: 2018-07-23 | Discharge: 2018-07-23 | Disposition: A | Discharge status: Home / Self Care | Payer: BLUE CROSS/BLUE SHIELD | Source: Ambulatory Visit | Attending: Obstetrics and Gynecology | Admitting: Obstetrics and Gynecology

## 2018-07-23 DIAGNOSIS — N6489 Other specified disorders of breast: Secondary | ICD-10-CM

## 2018-07-23 DIAGNOSIS — R928 Other abnormal and inconclusive findings on diagnostic imaging of breast: Secondary | ICD-10-CM | POA: Diagnosis not present

## 2019-05-03 ENCOUNTER — Encounter: Payer: BLUE CROSS/BLUE SHIELD | Admitting: Obstetrics and Gynecology

## 2019-05-10 DIAGNOSIS — M546 Pain in thoracic spine: Secondary | ICD-10-CM | POA: Diagnosis not present

## 2019-05-10 DIAGNOSIS — M9903 Segmental and somatic dysfunction of lumbar region: Secondary | ICD-10-CM | POA: Diagnosis not present

## 2019-05-10 DIAGNOSIS — M5137 Other intervertebral disc degeneration, lumbosacral region: Secondary | ICD-10-CM | POA: Diagnosis not present

## 2019-05-10 DIAGNOSIS — M9902 Segmental and somatic dysfunction of thoracic region: Secondary | ICD-10-CM | POA: Diagnosis not present

## 2019-05-11 DIAGNOSIS — M9903 Segmental and somatic dysfunction of lumbar region: Secondary | ICD-10-CM | POA: Diagnosis not present

## 2019-05-11 DIAGNOSIS — M9902 Segmental and somatic dysfunction of thoracic region: Secondary | ICD-10-CM | POA: Diagnosis not present

## 2019-05-11 DIAGNOSIS — M546 Pain in thoracic spine: Secondary | ICD-10-CM | POA: Diagnosis not present

## 2019-05-11 DIAGNOSIS — M5137 Other intervertebral disc degeneration, lumbosacral region: Secondary | ICD-10-CM | POA: Diagnosis not present

## 2019-05-17 DIAGNOSIS — M9903 Segmental and somatic dysfunction of lumbar region: Secondary | ICD-10-CM | POA: Diagnosis not present

## 2019-05-17 DIAGNOSIS — M9902 Segmental and somatic dysfunction of thoracic region: Secondary | ICD-10-CM | POA: Diagnosis not present

## 2019-05-17 DIAGNOSIS — M546 Pain in thoracic spine: Secondary | ICD-10-CM | POA: Diagnosis not present

## 2019-05-17 DIAGNOSIS — M5137 Other intervertebral disc degeneration, lumbosacral region: Secondary | ICD-10-CM | POA: Diagnosis not present

## 2019-05-23 DIAGNOSIS — M9903 Segmental and somatic dysfunction of lumbar region: Secondary | ICD-10-CM | POA: Diagnosis not present

## 2019-05-23 DIAGNOSIS — M546 Pain in thoracic spine: Secondary | ICD-10-CM | POA: Diagnosis not present

## 2019-05-23 DIAGNOSIS — M5137 Other intervertebral disc degeneration, lumbosacral region: Secondary | ICD-10-CM | POA: Diagnosis not present

## 2019-05-23 DIAGNOSIS — M9902 Segmental and somatic dysfunction of thoracic region: Secondary | ICD-10-CM | POA: Diagnosis not present

## 2019-05-24 DIAGNOSIS — M5137 Other intervertebral disc degeneration, lumbosacral region: Secondary | ICD-10-CM | POA: Diagnosis not present

## 2019-05-24 DIAGNOSIS — M546 Pain in thoracic spine: Secondary | ICD-10-CM | POA: Diagnosis not present

## 2019-05-24 DIAGNOSIS — M9902 Segmental and somatic dysfunction of thoracic region: Secondary | ICD-10-CM | POA: Diagnosis not present

## 2019-05-24 DIAGNOSIS — M9903 Segmental and somatic dysfunction of lumbar region: Secondary | ICD-10-CM | POA: Diagnosis not present

## 2019-05-26 DIAGNOSIS — M5137 Other intervertebral disc degeneration, lumbosacral region: Secondary | ICD-10-CM | POA: Diagnosis not present

## 2019-05-26 DIAGNOSIS — M9902 Segmental and somatic dysfunction of thoracic region: Secondary | ICD-10-CM | POA: Diagnosis not present

## 2019-05-26 DIAGNOSIS — M9903 Segmental and somatic dysfunction of lumbar region: Secondary | ICD-10-CM | POA: Diagnosis not present

## 2019-05-26 DIAGNOSIS — M546 Pain in thoracic spine: Secondary | ICD-10-CM | POA: Diagnosis not present

## 2019-05-30 DIAGNOSIS — R5383 Other fatigue: Secondary | ICD-10-CM | POA: Diagnosis not present

## 2019-05-30 DIAGNOSIS — I10 Essential (primary) hypertension: Secondary | ICD-10-CM | POA: Diagnosis not present

## 2019-05-30 DIAGNOSIS — E559 Vitamin D deficiency, unspecified: Secondary | ICD-10-CM | POA: Diagnosis not present

## 2019-05-30 DIAGNOSIS — Z131 Encounter for screening for diabetes mellitus: Secondary | ICD-10-CM | POA: Diagnosis not present

## 2019-06-06 DIAGNOSIS — E559 Vitamin D deficiency, unspecified: Secondary | ICD-10-CM | POA: Diagnosis not present

## 2019-06-06 DIAGNOSIS — R5383 Other fatigue: Secondary | ICD-10-CM | POA: Diagnosis not present

## 2019-06-06 DIAGNOSIS — I10 Essential (primary) hypertension: Secondary | ICD-10-CM | POA: Diagnosis not present

## 2019-06-20 DIAGNOSIS — E6609 Other obesity due to excess calories: Secondary | ICD-10-CM | POA: Diagnosis not present

## 2019-06-20 DIAGNOSIS — R5383 Other fatigue: Secondary | ICD-10-CM | POA: Diagnosis not present

## 2019-06-20 DIAGNOSIS — I10 Essential (primary) hypertension: Secondary | ICD-10-CM | POA: Diagnosis not present

## 2019-06-20 DIAGNOSIS — E559 Vitamin D deficiency, unspecified: Secondary | ICD-10-CM | POA: Diagnosis not present

## 2019-08-11 ENCOUNTER — Encounter: Payer: BC Managed Care – PPO | Admitting: Obstetrics and Gynecology

## 2019-08-23 DIAGNOSIS — D2239 Melanocytic nevi of other parts of face: Secondary | ICD-10-CM | POA: Diagnosis not present

## 2019-08-23 DIAGNOSIS — D1724 Benign lipomatous neoplasm of skin and subcutaneous tissue of left leg: Secondary | ICD-10-CM | POA: Diagnosis not present

## 2019-08-23 DIAGNOSIS — L814 Other melanin hyperpigmentation: Secondary | ICD-10-CM | POA: Diagnosis not present

## 2019-08-23 DIAGNOSIS — D229 Melanocytic nevi, unspecified: Secondary | ICD-10-CM | POA: Diagnosis not present

## 2019-08-23 DIAGNOSIS — D492 Neoplasm of unspecified behavior of bone, soft tissue, and skin: Secondary | ICD-10-CM | POA: Diagnosis not present

## 2019-08-29 ENCOUNTER — Ambulatory Visit: Payer: Self-pay | Attending: Internal Medicine

## 2019-08-29 DIAGNOSIS — Z23 Encounter for immunization: Secondary | ICD-10-CM

## 2019-08-29 NOTE — Progress Notes (Signed)
   Covid-19 Vaccination Clinic  Name:  JUN RIGHTMYER    MRN: [id number] DOB: [birth year]  08/29/2019  Ms. Ney was observed post Covid-19 immunization for 15 minutes without incident. She was provided with Vaccine Information Sheet and instruction to access the V-Safe system.   Ms. Kihn was instructed to call 911 with any severe reactions post vaccine: Marland Kitchen Difficulty breathing  . Swelling of face and throat  . A fast heartbeat  . A bad rash all over body  . Dizziness and weakness   Immunizations Administered    Name Date Dose VIS Date Route   Pfizer COVID-19 Vaccine 08/29/2019 11:38 AM 0.3 mL 04/29/2019 Intramuscular   Manufacturer: [employer], [employer]   Lot: [id number]   NDC: [address]-7

## 2019-09-27 ENCOUNTER — Ambulatory Visit: Payer: Self-pay | Attending: Internal Medicine

## 2019-09-27 DIAGNOSIS — Z23 Encounter for immunization: Secondary | ICD-10-CM

## 2019-09-27 NOTE — Progress Notes (Signed)
   Covid-19 Vaccination Clinic  Name:  Courtney Ali    MRN: [id number] DOB: 1976/02/21  09/27/2019  [name] was observed post Covid-19 immunization for 15 minutes without incident. She was provided with Vaccine Information Sheet and instruction to access the V-Safe system.   [name] was instructed to call 911 with any severe reactions post vaccine: Marland Kitchen Difficulty breathing  . Swelling of face and throat  . A fast heartbeat  . A bad rash all over body  . Dizziness and weakness   Immunizations Administered    Name Date Dose VIS Date Route   Pfizer COVID-19 Vaccine 09/27/2019 10:59 AM 0.3 mL 07/13/2018 Intramuscular   Manufacturer: [employer], [employer]   Lot: [id number]   NDC: [address]-0

## 2019-10-18 NOTE — Progress Notes (Signed)
Pt present for bump on vaginal area. Pt stated noticing the bump about a month ago. Pt denies any pain from the area.

## 2019-10-19 ENCOUNTER — Encounter: Payer: Self-pay | Admitting: Obstetrics and Gynecology

## 2019-10-19 ENCOUNTER — Other Ambulatory Visit: Payer: Self-pay

## 2019-10-19 ENCOUNTER — Ambulatory Visit (INDEPENDENT_AMBULATORY_CARE_PROVIDER_SITE_OTHER): Payer: BC Managed Care – PPO | Admitting: Obstetrics and Gynecology

## 2019-10-19 VITALS — BP 151/94 | HR 63 | Ht 68.5 in | Wt 216.4 lb

## 2019-10-19 DIAGNOSIS — N750 Cyst of Bartholin's gland: Secondary | ICD-10-CM

## 2019-10-19 DIAGNOSIS — I10 Essential (primary) hypertension: Secondary | ICD-10-CM

## 2019-10-19 DIAGNOSIS — Z1231 Encounter for screening mammogram for malignant neoplasm of breast: Secondary | ICD-10-CM | POA: Diagnosis not present

## 2019-10-19 NOTE — Patient Instructions (Signed)
Bartholin's Cyst  A Bartholin's cyst is a fluid-filled sac that forms on a Bartholin's gland. Bartholin's glands are small glands in the folds of skin around the vaginal opening (labia). These glands produce a fluid to moisten (lubricate) the outside of the vagina during sex. A cyst that is not large or infected may not cause any problems or require treatment. If the cyst gets infected, it is called a Bartholin's abscess. An abscess may cause symptoms such as pain and swelling and is more likely to require treatment. What are the causes? This condition may be caused by a blocked Bartholin's gland. These glands can become blocked due to natural buildup of fluid and oils. Bacteria inside of the cyst can cause infection. In many cases, the cause is not known. What increases the risk? You may be at increased risk of developing a Bartholin's cyst or abscess if:  You are of childbearing age.  You have a history of Bartholin's cysts or abscesses.  You have diabetes.  You have an STI (sexually transmitted infection). What are the signs or symptoms? Symptoms may include:  A bulge or lump on the labia, near the lower opening of the vagina.  Discomfort or pain. This may get worse during sex or when walking.  Redness, swelling, or fluid draining from the area. These may be signs of an abscess. How severe your symptoms are depends on the size of your cyst and whether it is infected. Infection causes symptoms to get more severe. How is this diagnosed? This condition may be diagnosed based on:  Your symptoms and medical history.  A physical exam to check for swelling in your vaginal area. You may lie on your back on an exam table and have your feet placed into footrests for the exam.  Blood tests to check for infections.  Removal of a fluid sample from the cyst or abscess (biopsy) for testing. You may work with a health care provider who specializes in women's health (gynecologist) for diagnosis  and treatment. How is this treated? If your cyst is small, not infected, and not causing symptoms, you may not need any treatment. These cysts often go away on their own, with home care such as hot baths or warm compresses. If you have a large cyst or an abscess, treatment may include:  Antibiotic medicine.  A procedure to drain the fluid inside the cyst or abscess. These procedures involve making an incision in the cyst or abscess so that the fluid drains out, and then one of the following may be done: ? A small, thin tube (catheter) may be placed inside the cyst or abscess so that it does not close and fill up with fluid again (fistulization). The catheter will be removed at a follow-up visit. ? The edges of the incision may be stitched to your skin so that the cyst or abscess stays open (marsupialization). This allows it to continue to drain and not fill up with fluid again. If you have cysts or abscesses that keep returning (recurring) and have required incision and drainage multiple times, your health care provider may talk with you about surgery to remove the Bartholin's gland. Follow these instructions at home: Medicines  Take over-the-counter and prescription medicines only as told by your health care provider.  If you were prescribed an antibiotic medicine, take it as told by your health care provider. Do not stop taking the antibiotic even if your condition improves. Managing pain and swelling  Try sitz baths to help with   pain and swelling. A sitz bath is a warm water bath in which the water only comes up to your hips and should cover your buttocks. You may take sitz baths several times a day.  Apply heat to the affected area as often as needed. Use the heat source that your health care provider recommends, such as a moist heat pack or a heating pad. ? Place a towel between your skin and the heat source. ? Leave the heat on for 20-30 minutes. ? Remove the heat if your skin turns  bright red. This is especially important if you are unable to feel pain, heat, or cold. You may have a greater risk of getting burned. General instructions  If your cyst or abscess was drained, follow instructions from your health care provider about how to take care of your wound. Use feminine pads as needed to absorb any drainage.  Do not push on or squeeze your cyst.  Do not have sex until the cyst has gone away or your wound from drainage has healed.  Take these steps to help prevent a Bartholin's cyst from returning, and to prevent other Bartholin's cysts from developing: ? Take a bath or shower once a day. Clean your vaginal area with mild soap and water when you bathe. ? Practice safe sex to prevent STIs. Talk with your health care provider about how to prevent STIs and which forms of birth control (contraception) may be best for you.  Keep all follow-up visits as told by your health care provider. This is important. Contact a health care provider if:  You have a fever.  You develop redness, swelling, or pain around your cyst.  You have fluid, blood, pus, or a bad smell coming from your cyst.  You have a cyst that gets larger or comes back. Summary  A Bartholin's cyst is a fluid-filled sac that forms on a Bartholin's gland. These glands are in the folds of skin around the vaginal opening (labia).  If your cyst is small, not infected, and not causing symptoms, you may not need any treatment. These cysts often go away on their own, with home care such as hot baths or warm compresses.  If you have a large cyst or an abscess, your health care provider may perform a procedure to drain the fluid.  If you have cysts or abscesses that keep returning (recurring) and have required incision and drainage multiple times, your health care provider may talk with you about surgery to remove the Bartholin's gland. This information is not intended to replace advice given to you by your health  care provider. Make sure you discuss any questions you have with your health care provider. Document Revised: 02/25/2018 Document Reviewed: 02/04/2017 Elsevier Patient Education  2020 [employer]  

## 2019-10-19 NOTE — Progress Notes (Signed)
    GYNECOLOGY PROGRESS NOTE  Subjective:    Patient ID: Courtney Ali, female    DOB: 1976/02/05, 44 y.o.   MRN: [id number]  HPI  Patient is a 44 y.o. G29P2003 female who presents for complaints of a vaginal bump x 1 month. Denies any pain, burning, irritation. Notes she discovered it while wiping after urination.   Also notes she desires a mammogram referral.  Has her annual exam in September, was overdue.   The following portions of the patient's history were reviewed and updated as appropriate: allergies, current medications, past family history, past medical history, past social history, past surgical history and problem list.  Review of Systems Pertinent items noted in HPI and remainder of comprehensive ROS otherwise negative.   Objective:   Blood pressure (!) 151/94, pulse 63, height 5' 8.5" (1.74 m), weight 216 lb 6.4 oz (98.2 kg). General appearance: alert and no distress Pelvic: Small partially deflated cystic area on right labia minora near introitus, consistent with Bartholin's gland cyst. Non-tender to palpation, flesh colored. Otherwise normal external genitalia. Grade 1-2 Rectocele present at introitus. Internal pelvic exam not performed.    Assessment:   1. Bartholin cyst   2. Breast cancer screening by mammogram   3. Essential hypertension     Plan:   1. Bartholin's cyst, appears to be resolving. No evidence of infection. Given reassurance. No interventions necessary at this time but did discuss possible home treatment measures.  2. Mammogram ordered placed at patient's request.  3. Elevated BP noted with h/o HTN. Patient states she was recently placed on Lisinopril by her PCP. Notes she checks her BPs at home, are usually 130s/80s.  Thinks she was a little nervous about today's visit.   Return to clinic for any scheduled appointments or for any gynecologic concerns as needed. Has annual exam in September.    Hildred Laser, MD [facility]

## 2020-01-03 ENCOUNTER — Encounter: Payer: BC Managed Care – PPO | Admitting: Obstetrics and Gynecology

## 2020-02-09 ENCOUNTER — Encounter: Payer: BC Managed Care – PPO | Admitting: Obstetrics and Gynecology

## 2020-04-24 NOTE — Patient Instructions (Addendum)
Preventive Care 40-43 Years Old, Female Preventive care refers to visits with your health care provider and lifestyle choices that can promote health and wellness. This includes:  A yearly physical exam. This may also be called an annual well check.  Regular dental visits and eye exams.  Immunizations.  Screening for certain conditions.  Healthy lifestyle choices, such as eating a healthy diet, getting regular exercise, not using drugs or products that contain nicotine and tobacco, and limiting alcohol use. What can I expect for my preventive care visit? Physical exam Your health care provider will check your:  Height and weight. This may be used to calculate body mass index (BMI), which tells if you are at a healthy weight.  Heart rate and blood pressure.  Skin for abnormal spots. Counseling Your health care provider may ask you questions about your:  Alcohol, tobacco, and drug use.  Emotional well-being.  Home and relationship well-being.  Sexual activity.  Eating habits.  Work and work environment.  Method of birth control.  Menstrual cycle.  Pregnancy history. What immunizations do I need?  Influenza (flu) vaccine  This is recommended every year. Tetanus, diphtheria, and pertussis (Tdap) vaccine  You may need a Td booster every 10 years. Varicella (chickenpox) vaccine  You may need this if you have not been vaccinated. Zoster (shingles) vaccine  You may need this after age 60. Measles, mumps, and rubella (MMR) vaccine  You may need at least one dose of MMR if you were born in 1957 or later. You may also need a second dose. Pneumococcal conjugate (PCV13) vaccine  You may need this if you have certain conditions and were not previously vaccinated. Pneumococcal polysaccharide (PPSV23) vaccine  You may need one or two doses if you smoke cigarettes or if you have certain conditions. Meningococcal conjugate (MenACWY) vaccine  You may need this if you  have certain conditions. Hepatitis A vaccine  You may need this if you have certain conditions or if you travel or work in places where you may be exposed to hepatitis A. Hepatitis B vaccine  You may need this if you have certain conditions or if you travel or work in places where you may be exposed to hepatitis B. Haemophilus influenzae type b (Hib) vaccine  You may need this if you have certain conditions. Human papillomavirus (HPV) vaccine  If recommended by your health care provider, you may need three doses over 6 months. You may receive vaccines as individual doses or as more than one vaccine together in one shot (combination vaccines). Talk with your health care provider about the risks and benefits of combination vaccines. What tests do I need? Blood tests  Lipid and cholesterol levels. These may be checked every 5 years, or more frequently if you are over 50 years old.  Hepatitis C test.  Hepatitis B test. Screening  Lung cancer screening. You may have this screening every year starting at age 55 if you have a 30-pack-year history of smoking and currently smoke or have quit within the past 15 years.  Colorectal cancer screening. All adults should have this screening starting at age 50 and continuing until age 75. Your health care provider may recommend screening at age 45 if you are at increased risk. You will have tests every 1-10 years, depending on your results and the type of screening test.  Diabetes screening. This is done by checking your blood sugar (glucose) after you have not eaten for a while (fasting). You may have this   done every 1-3 years.  Mammogram. This may be done every 1-2 years. Talk with your health care provider about when you should start having regular mammograms. This may depend on whether you have a family history of breast cancer.  BRCA-related cancer screening. This may be done if you have a family history of breast, ovarian, tubal, or peritoneal  cancers.  Pelvic exam and Pap test. This may be done every 3 years starting at age 70. Starting at age 87, this may be done every 5 years if you have a Pap test in combination with an HPV test. Other tests  Sexually transmitted disease (STD) testing.  Bone density scan. This is done to screen for osteoporosis. You may have this scan if you are at high risk for osteoporosis. Follow these instructions at home: Eating and drinking  Eat a diet that includes fresh fruits and vegetables, whole grains, lean protein, and low-fat dairy.  Take vitamin and mineral supplements as recommended by your health care provider.  Do not drink alcohol if: ? Your health care provider tells you not to drink. ? You are pregnant, may be pregnant, or are planning to become pregnant.  If you drink alcohol: ? Limit how much you have to 0-1 drink a day. ? Be aware of how much alcohol is in your drink. In the [place] one drink equals one 12 oz bottle of beer (355 mL), one 5 oz glass of wine (148 mL), or one 1 oz glass of hard liquor (44 mL). Lifestyle  Take daily care of your teeth and gums.  Stay active. Exercise for at least 30 minutes on 5 or more days each week.  Do not use any products that contain nicotine or tobacco, such as cigarettes, e-cigarettes, and chewing tobacco. If you need help quitting, ask your health care provider.  If you are sexually active, practice safe sex. Use a condom or other form of birth control (contraception) in order to prevent pregnancy and STIs (sexually transmitted infections).  If told by your health care provider, take low-dose aspirin daily starting at age 59. What's next?  Visit your health care provider once a year for a well check visit.  Ask your health care provider how often you should have your eyes and teeth checked.  Stay up to date on all vaccines. This information is not intended to replace advice given to you by your health care provider. Make sure you  discuss any questions you have with your health care provider. Document Revised: 01/14/2018 Document Reviewed: 01/14/2018 Elsevier Patient Education  2020 [employer] Breast self-awareness is knowing how your breasts look and feel. Doing breast self-awareness is important. It allows you to catch a breast problem early while it is still small and can be treated. All women should do breast self-awareness, including women who have had breast implants. Tell your doctor if you notice a change in your breasts. What you need:  A mirror.  A well-lit room. How to do a breast self-exam A breast self-exam is one way to learn what is normal for your breasts and to check for changes. To do a breast self-exam: Look for changes  1. Take off all the clothes above your waist. 2. Stand in front of a mirror in a room with good lighting. 3. Put your hands on your hips. 4. Push your hands down. 5. Look at your breasts and nipples in the mirror to see if one breast or nipple looks different  other. Check to see if: ? The shape of one breast is different. ? The size of one breast is different. ? There are wrinkles, dips, and bumps in one breast and not the other. 6. Look at each breast for changes in the skin, such as: ? Redness. ? Scaly areas. 7. Look for changes in your nipples, such as: ? Liquid around the nipples. ? Bleeding. ? Dimpling. ? Redness. ? A change in where the nipples are. Feel for changes  1. Lie on your back on the floor. 2. Feel each breast. To do this, follow these steps: ? Pick a breast to feel. ? Put the arm closest to that breast above your head. ? Use your other arm to feel the nipple area of your breast. Feel the area with the pads of your three middle fingers by making small circles with your fingers. For the first circle, press lightly. For the second circle, press harder. For the third circle, press even harder. ? Keep making circles with  your fingers at the different pressures as you move down your breast. Stop when you feel your ribs. ? Move your fingers a little toward the center of your body. ? Start making circles with your fingers again, this time going up until you reach your collarbone. ? Keep making up-and-down circles until you reach your armpit. Remember to keep using the three pressures. ? Feel the other breast in the same way. 3. Sit or stand in the tub or shower. 4. With soapy water on your skin, feel each breast the same way you did in step 2 when you were lying on the floor. Write down what you find Writing down what you find can help you remember what to tell your doctor. Write down:  What is normal for each breast.  Any changes you find in each breast, including: ? The kind of changes you find. ? Whether you have pain. ? Size and location of any lumps.  When you last had your menstrual period. General tips  Check your breasts every month.  If you are breastfeeding, the best time to check your breasts is after you feed your baby or after you use a breast pump.  If you get menstrual periods, the best time to check your breasts is 5-7 days after your menstrual period is over.  With time, you will become comfortable with the self-exam, and you will begin to know if there are changes in your breasts. Contact a doctor if you:  See a change in the shape or size of your breasts or nipples.  See a change in the skin of your breast or nipples, such as red or scaly skin.  Have fluid coming from your nipples that is not normal.  Find a lump or thick area that was not there before.  Have pain in your breasts.  Have any concerns about your breast health. Summary  Breast self-awareness includes looking for changes in your breasts, as well as feeling for changes within your breasts.  Breast self-awareness should be done in front of a mirror in a well-lit room.  You should check your breasts every month.  If you get menstrual periods, the best time to check your breasts is 5-7 days after your menstrual period is over.  Let your doctor know of any changes you see in your breasts, including changes in size, changes on the skin, pain or tenderness, or fluid from your nipples that is not normal. This information is not   intended to replace advice given to you by your health care provider. Make sure you discuss any questions you have with your health care provider. Document Revised: 12/22/2017 Document Reviewed: 12/22/2017 Elsevier Patient Education  2020 [employer] Inc.  

## 2020-04-24 NOTE — Progress Notes (Signed)
Pt present for annual exam. Pt stated that she was doing well and denies any issues at this time. Pt declined flu vaccine at this time.

## 2020-04-25 ENCOUNTER — Ambulatory Visit (INDEPENDENT_AMBULATORY_CARE_PROVIDER_SITE_OTHER): Payer: BC Managed Care – PPO | Admitting: Obstetrics and Gynecology

## 2020-04-25 ENCOUNTER — Encounter: Payer: Self-pay | Admitting: Obstetrics and Gynecology

## 2020-04-25 ENCOUNTER — Other Ambulatory Visit: Payer: Self-pay

## 2020-04-25 ENCOUNTER — Other Ambulatory Visit: Payer: Self-pay | Admitting: Obstetrics and Gynecology

## 2020-04-25 VITALS — BP 144/90 | HR 67 | Ht 68.5 in | Wt 229.1 lb

## 2020-04-25 DIAGNOSIS — I10 Essential (primary) hypertension: Secondary | ICD-10-CM

## 2020-04-25 DIAGNOSIS — Z01419 Encounter for gynecological examination (general) (routine) without abnormal findings: Secondary | ICD-10-CM

## 2020-04-25 DIAGNOSIS — R928 Other abnormal and inconclusive findings on diagnostic imaging of breast: Secondary | ICD-10-CM

## 2020-04-25 DIAGNOSIS — Z1231 Encounter for screening mammogram for malignant neoplasm of breast: Secondary | ICD-10-CM

## 2020-04-25 DIAGNOSIS — E785 Hyperlipidemia, unspecified: Secondary | ICD-10-CM | POA: Diagnosis not present

## 2020-04-25 DIAGNOSIS — N6489 Other specified disorders of breast: Secondary | ICD-10-CM

## 2020-04-25 DIAGNOSIS — I498 Other specified cardiac arrhythmias: Secondary | ICD-10-CM | POA: Diagnosis not present

## 2020-04-25 DIAGNOSIS — Z975 Presence of (intrauterine) contraceptive device: Secondary | ICD-10-CM | POA: Diagnosis not present

## 2020-04-25 DIAGNOSIS — Z131 Encounter for screening for diabetes mellitus: Secondary | ICD-10-CM

## 2020-04-25 NOTE — Progress Notes (Signed)
GYNECOLOGY ANNUAL PHYSICAL EXAM PROGRESS NOTE  Subjective:    Courtney Ali is a 44 y.o. [id number] married female who presents for an annual exam. . The patient is sexually active. The patient wears seatbelts: yes. The patient participates in regular exercise: yes. Has the patient ever been transfused or tattooed?: yes (professional tattoo). The patient reports that there is not domestic violence in her life.   The patient has the following complaints today:  1. Patient complains of what feels like "heart flutters" occasionally over the past month.  Denies shortness of breath or chest pain.    Menstrual History: Patient's last menstrual period was 04/02/2020 (within days). Period Duration (Days): 2 Period Pattern: Regular Menstrual Flow: Light Menstrual Control: Panty liner Menstrual Control Change Freq (Hours): 2-3 Dysmenorrhea: None   Gynecologic History Contraception: Mirena IUD History of STI's: Denies Last Pap: 04/06/2018. Results were: normal.  Denies h/o abnormal pap smears. Last mammogram: 07/23/2018. Results were: normal    Upstream - 04/25/20 1017      Pregnancy Intention Screening   Does the patient want to become pregnant in the next year? No    Does the patient's partner want to become pregnant in the next year? No    Would the patient like to discuss contraceptive options today? No      Contraception Wrap Up   Current Method IUD or IUS          The pregnancy intention screening data noted above was reviewed. Potential methods of contraception were not discussed. The patient elected to continue with IUD or IUS.    OB History  Gravida Para Term Preterm AB Living  2 2 2  0 0 3  SAB TAB Ectopic Multiple Live Births  0 0 0 1 3    # Outcome Date GA Lbr Len/2nd Weight Sex Delivery Anes PTL Lv  2A Term     F Vag-Spont   LIV  2B Term     F Vag-Spont   LIV  1 Term    9 lb 9.6 oz (4.355 kg) F Vag-Spont   LIV    Past Medical History:  Diagnosis Date  .  Hypertension   . Increased BMI   . PIH (pregnancy induced hypertension)   . Post partum depression   . Vaginal atrophy    breastfeeding induced    Past Surgical History:  Procedure Laterality Date  . artificial inseminaiton  04/2012    Family History  Problem Relation Age of Onset  . Diabetes Paternal Grandmother   . Healthy Mother   . Hypertension Father   . Hypertension Brother   . Atrial fibrillation Maternal Aunt   . Heart disease Neg Hx   . Breast cancer Neg Hx   . Colon cancer Neg Hx   . Ovarian cancer Neg Hx   . Cancer Neg Hx     Social History   Socioeconomic History  . Marital status: Married    Spouse name: Not on file  . Number of children: Not on file  . Years of education: Not on file  . Highest education level: Not on file  Occupational History  . Not on file  Tobacco Use  . Smoking status: Never Smoker  . Smokeless tobacco: Never Used  Vaping Use  . Vaping Use: Never used  Substance and Sexual Activity  . Alcohol use: Yes    Comment: socially  . Drug use: No  . Sexual activity: Yes    Birth control/protection: I.U.D.  Other Topics Concern  . Not on file  Social History Narrative  . Not on file   Social Determinants of Health   Financial Resource Strain:   . Difficulty of Paying Living Expenses: Not on file  Food Insecurity:   . Worried About Programme researcher, broadcasting/film/video in the Last Year: Not on file  . Ran Out of Food in the Last Year: Not on file  Transportation Needs:   . Lack of Transportation (Medical): Not on file  . Lack of Transportation (Non-Medical): Not on file  Physical Activity:   . Days of Exercise per Week: Not on file  . Minutes of Exercise per Session: Not on file  Stress:   . Feeling of Stress : Not on file  Social Connections:   . Frequency of Communication with Friends and Family: Not on file  . Frequency of Social Gatherings with Friends and Family: Not on file  . Attends Religious Services: Not on file  . Active Member  of Clubs or Organizations: Not on file  . Attends Banker Meetings: Not on file  . Marital Status: Not on file  Intimate Partner Violence:   . Fear of Current or Ex-Partner: Not on file  . Emotionally Abused: Not on file  . Physically Abused: Not on file  . Sexually Abused: Not on file    Current Outpatient Medications on File Prior to Visit  Medication Sig Dispense Refill  . levonorgestrel (MIRENA) 20 MCG/24HR IUD 1 each by Intrauterine route once.    Marland Kitchen lisinopril (ZESTRIL) 10 MG tablet Take 10 mg by mouth daily.     No current facility-administered medications on file prior to visit.    No Known Allergies    Review of Systems Constitutional: negative for chills, fatigue, fevers and sweats Eyes: negative for irritation, redness and visual disturbance Ears, nose, mouth, throat, and face: negative for hearing loss, nasal congestion, snoring and tinnitus Respiratory: negative for asthma, cough, sputum Cardiovascular: negative for chest pain, dyspnea, exertional chest pressure/discomfort, irregular heart beat, palpitations and syncope.  Heart flutters x 1-2 months. Gastrointestinal: negative for abdominal pain, change in bowel habits, nausea and vomiting Genitourinary: negative for abnormal menstrual periods, genital lesions, sexual problems and vaginal discharge, dysuria and urinary incontinence Integument/breast: negative for breast lump, breast tenderness and nipple discharge Hematologic/lymphatic: negative for bleeding and easy bruising Musculoskeletal:negative for back pain and muscle weakness Neurological: negative for dizziness, headaches, vertigo and weakness Endocrine: negative for diabetic symptoms including polydipsia, polyuria and skin dryness Allergic/Immunologic: negative for hay fever and urticaria        Objective:  Blood pressure (!) 144/90, pulse 67, height 5' 8.5" (1.74 m), weight 229 lb 1.6 oz (103.9 kg), last menstrual period 04/02/2020. Body  mass index is 34.33 kg/m.  General Appearance:    Alert, cooperative, no distress, appears stated age  Head:    Normocephalic, without obvious abnormality, atraumatic  Eyes:    PERRL, conjunctiva/corneas clear, EOM's intact, both eyes  Ears:    Normal external ear canals, both ears  Nose:   Nares normal, septum midline, mucosa normal, no drainage or sinus tenderness  Throat:   Lips, mucosa, and tongue normal; teeth and gums normal  Neck:   Supple, symmetrical, trachea midline, no adenopathy; thyroid: no enlargement/tenderness/nodules; no carotid bruit or JVD  Back:     Symmetric, no curvature, ROM normal, no CVA tenderness  Lungs:     Clear to auscultation bilaterally, respirations unlabored  Chest Wall:    No  tenderness or deformity   Heart:    Regular rate and rhythm, S1 and S2 normal, no murmur, rub or gallop  Breast Exam:    No tenderness, masses, or nipple abnormality  Abdomen:     Soft, non-tender, bowel sounds active all four quadrants, no masses, no organomegaly.    Genitalia:    Pelvic:external genitalia normal, vagina without lesions, discharge, or tenderness, rectovaginal septum  normal. Cervix normal in appearance, no cervical motion tenderness, IUD threads visible, ~ 3 cm in length. No adnexal masses or tenderness.  Uterus normal size, shape, mobile, regular contours, nontender.  Rectal:    Normal external sphincter.  No hemorrhoids appreciated. Internal exam not done.   Extremities:   Extremities normal, atraumatic, no cyanosis or edema  Pulses:   2+ and symmetric all extremities  Skin:   Skin color, texture, turgor normal, no rashes or lesions  Lymph nodes:   Cervical, supraclavicular, and axillary nodes normal  Neurologic:   CNII-XII intact, normal strength, sensation and reflexes throughout   .  Labs:   None  Assessment:   1. Encounter for well woman exam with routine gynecological exam   2. Breast cancer screening by mammogram   3. IUD (intrauterine device) in  place   4. Essential hypertension   5. Hyperlipidemia, unspecified hyperlipidemia type   6. Periodic heart flutter   7. Screening for diabetes mellitus     Plan:    Blood tests: CBC with diff, Comprehensive metabolic panel, Lipoproteins, TSH and HgbA1c. Breast self exam technique reviewed and patient encouraged to perform self-exam monthly. Contraception: IUD. Discussed healthy lifestyle modifications. Pap smear up to date.  Continue routine screening. Due in 1 year.  COVID vaccination status: up to date. For booster vaccination when eligible.  Flu vaccine completed.  Referral placed to Cardiology in light of patient's heart flutters. Also with h/o HTN, currently on LIsinopril. Also encouraged to f/u with PCP    Hildred Laser, MD [facility]

## 2020-04-26 LAB — COMPREHENSIVE METABOLIC PANEL
ALT: 25 IU/L (ref 0–32)
AST: 19 IU/L (ref 0–40)
Albumin/Globulin Ratio: 2.3 — ABNORMAL HIGH (ref 1.2–2.2)
Albumin: 4.4 g/dL (ref 3.8–4.8)
Alkaline Phosphatase: 82 IU/L (ref 44–121)
BUN/Creatinine Ratio: 11 (ref 9–23)
BUN: 8 mg/dL (ref 6–24)
Bilirubin Total: 0.5 mg/dL (ref 0.0–1.2)
CO2: 21 mmol/L (ref 20–29)
Calcium: 9.1 mg/dL (ref 8.7–10.2)
Chloride: 104 mmol/L (ref 96–106)
Creatinine, Ser: 0.74 mg/dL (ref 0.57–1.00)
GFR calc Af Amer: 115 mL/min/{1.73_m2} (ref >59)
GFR calc non Af Amer: 100 mL/min/{1.73_m2} (ref >59)
Globulin, Total: 1.9 g/dL (ref 1.5–4.5)
Glucose: 91 mg/dL (ref 65–99)
Potassium: 4.4 mmol/L (ref 3.5–5.2)
Sodium: 138 mmol/L (ref 134–144)
Total Protein: 6.3 g/dL (ref 6.0–8.5)

## 2020-04-26 LAB — CBC
Hematocrit: 41.5 % (ref 34.0–46.6)
Hemoglobin: 14.3 g/dL (ref 11.1–15.9)
MCH: 30.3 pg (ref 26.6–33.0)
MCHC: 34.5 g/dL (ref 31.5–35.7)
MCV: 88 fL (ref 79–97)
Platelets: 254 10*3/uL (ref 150–450)
RBC: 4.72 x10E6/uL (ref 3.77–5.28)
RDW: 12.5 % (ref 11.7–15.4)
WBC: 7.1 10*3/uL (ref 3.4–10.8)

## 2020-04-26 LAB — HEMOGLOBIN A1C
Est. average glucose Bld gHb Est-mCnc: 111 mg/dL
Hgb A1c MFr Bld: 5.5 % (ref 4.8–5.6)

## 2020-04-26 LAB — LIPID PANEL
Chol/HDL Ratio: 4.2 ratio (ref 0.0–4.4)
Cholesterol, Total: 191 mg/dL (ref 100–199)
HDL: 45 mg/dL (ref >39)
LDL Chol Calc (NIH): 127 mg/dL — ABNORMAL HIGH (ref 0–99)
Triglycerides: 104 mg/dL (ref 0–149)
VLDL Cholesterol Cal: 19 mg/dL (ref 5–40)

## 2020-04-26 LAB — TSH: TSH: 1.04 u[IU]/mL (ref 0.450–4.500)

## 2020-05-16 ENCOUNTER — Ambulatory Visit
Admission: RE | Admit: 2020-05-16 | Discharge: 2020-05-16 | Disposition: A | Discharge status: Home / Self Care | Payer: BC Managed Care – PPO | Source: Ambulatory Visit | Attending: Obstetrics and Gynecology | Admitting: Obstetrics and Gynecology

## 2020-05-16 ENCOUNTER — Other Ambulatory Visit: Payer: Self-pay | Admitting: Obstetrics and Gynecology

## 2020-05-16 ENCOUNTER — Other Ambulatory Visit: Payer: Self-pay

## 2020-05-16 DIAGNOSIS — R928 Other abnormal and inconclusive findings on diagnostic imaging of breast: Secondary | ICD-10-CM

## 2020-05-16 DIAGNOSIS — R922 Inconclusive mammogram: Secondary | ICD-10-CM | POA: Diagnosis not present

## 2020-05-16 DIAGNOSIS — N6489 Other specified disorders of breast: Secondary | ICD-10-CM

## 2020-06-05 DIAGNOSIS — Z20822 Contact with and (suspected) exposure to covid-19: Secondary | ICD-10-CM | POA: Diagnosis not present

## 2020-06-05 DIAGNOSIS — Z03818 Encounter for observation for suspected exposure to other biological agents ruled out: Secondary | ICD-10-CM | POA: Diagnosis not present

## 2020-10-02 ENCOUNTER — Ambulatory Visit (INDEPENDENT_AMBULATORY_CARE_PROVIDER_SITE_OTHER): Payer: BC Managed Care – PPO | Admitting: Cardiovascular Disease

## 2020-10-02 ENCOUNTER — Other Ambulatory Visit: Payer: Self-pay

## 2020-10-02 ENCOUNTER — Encounter: Payer: Self-pay | Admitting: Cardiovascular Disease

## 2020-10-02 VITALS — BP 128/82 | HR 87 | Ht 68.5 in | Wt 223.2 lb

## 2020-10-02 DIAGNOSIS — R002 Palpitations: Secondary | ICD-10-CM

## 2020-10-02 DIAGNOSIS — I10 Essential (primary) hypertension: Secondary | ICD-10-CM | POA: Diagnosis not present

## 2020-10-02 NOTE — Progress Notes (Signed)
Cardiology Office Note  Date:  10/02/2020   ID:  Courtney Ali, DOB 08-09-1975, MRN [id number]  PCP:  Lyndon Code, MD   Chief Complaint  Patient presents with  . New Patient (Initial Visit)    Ref by Dr. Valentino Saxon for HTN & fluttering in chest. Patient c/o fluttering in chest when lying on her left side, symptoms  come and go. Medications reviewed by the patient verbally.     HPI:  Courtney Ali is a 45 year old woman with past medical history of Hypertension Presenting by referral from Dr. Hildred Laser for consultation of her hypertension, periodic heart fluttering  Seen by Dr. Valentino Saxon, blood pressure at that time 151/94 June 2021 Blood pressure and follow-up with Dr. Valentino Saxon December 2021 was 144/90  She reports that her blood pressure at home typically 130/80, sometimes lower as seen on today's visit  Works out at [employer] frequently, Denies any chest pain shortness of breath tachypalpitations when working out  Sometimes when going to sleep will appreciate cardiac palpitations especially when laying on her left side Started having palpitations end of 2021 around November and December, seem to resolve then have come back again  Significant stress at work, runs [employer], significant stressors that come with that Has 3 children  EKG personally reviewed by myself on todays visit Normal sinus rhythm with rate 87 bpm no significant ST-T wave changes   PMH:   has a past medical history of Hypertension, Increased BMI, PIH (pregnancy induced hypertension), Post partum depression, and Vaginal atrophy.  PSH:    Past Surgical History:  Procedure Laterality Date  . artificial inseminaiton  04/2012    Current Outpatient Medications  Medication Sig Dispense Refill  . levonorgestrel (MIRENA) 20 MCG/24HR IUD 1 each by Intrauterine route once.    Marland Kitchen lisinopril (ZESTRIL) 10 MG tablet Take 10 mg by mouth daily.     No current facility-administered medications for this  visit.     Allergies:   Patient has no known allergies.   Social History:  The patient  reports that she has never smoked. She has never used smokeless tobacco. She reports current alcohol use. She reports that she does not use drugs.   Family History:   family history includes Atrial fibrillation in her maternal aunt; Diabetes in her paternal grandmother; Healthy in her mother; Hypertension in her brother and father.    Review of Systems: Review of Systems  Constitutional: Negative.   HENT: Negative.   Respiratory: Negative.   Cardiovascular: Positive for palpitations.  Gastrointestinal: Negative.   Musculoskeletal: Negative.   Neurological: Negative.   Psychiatric/Behavioral: Negative.   All other systems reviewed and are negative.   PHYSICAL EXAM: VS:  BP 128/82 (BP Location: Right Arm, Patient Position: Sitting, Cuff Size: Large)   Pulse 87   Ht 5' 8.5" (1.74 m)   Wt 223 lb 4 oz (101.3 kg)   SpO2 99%   BMI 33.45 kg/m  , BMI Body mass index is 33.45 kg/m. GEN: Well nourished, well developed, in no acute distress HEENT: normal Neck: no JVD, carotid bruits, or masses Cardiac: RRR; no murmurs, rubs, or gallops,no edema  Respiratory:  clear to auscultation bilaterally, normal work of breathing GI: soft, nontender, nondistended, + BS MS: no deformity or atrophy Skin: warm and dry, no rash Neuro:  Strength and sensation are intact Psych: euthymic mood, full affect   Recent Labs: 04/25/2020: ALT 25; BUN 8; Creatinine, Ser 0.74; Hemoglobin 14.3; Platelets 254; Potassium 4.4; Sodium  138; TSH 1.040    Lipid Panel Lab Results  Component Value Date   CHOL 191 04/25/2020   HDL 45 04/25/2020   LDLCALC 127 (H) 04/25/2020   TRIG 104 04/25/2020      Wt Readings from Last 3 Encounters:  10/02/20 223 lb 4 oz (101.3 kg)  04/25/20 229 lb 1.6 oz (103.9 kg)  10/19/19 216 lb 6.4 oz (98.2 kg)       ASSESSMENT AND PLAN:  Problem List Items Addressed This Visit   None    Visit Diagnoses    Essential hypertension    -  Primary   Relevant Orders   EKG 12-Lead   Palpitations       Relevant Orders   EKG 12-Lead      Paroxysmal tachycardia/palpitations Likely having PACs, PVCs, unable to exclude short runs of atrial tachycardia or SVT If symptoms get worse could order a ZIO monitor Also discussed use of beta-blockers if symptoms get worse Likely exacerbated by stressors, 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 Valentino Saxon and be referred back to her office for ongoing care of the issues detailed above  Signed, Dossie Arbour, M.D., Ph.D. [facility] [place], [place] [phone]

## 2020-10-02 NOTE — Patient Instructions (Signed)
Medication Instructions:  No changes  If you need a refill on your cardiac medications before your next appointment, please call your pharmacy.    Lab work: No new labs needed   If you have labs (blood work) drawn today and your tests are completely normal, you will receive your results only by: . MyChart Message (if you have MyChart) OR . A paper copy in the mail If you have any lab test that is abnormal or we need to change your treatment, we will call you to review the results.   Testing/Procedures: No new testing needed   Follow-Up: At [facility], you and your health needs are our priority.  As part of our continuing mission to provide you with exceptional heart care, we have created designated Provider Care Teams.  These Care Teams include your primary Cardiologist (physician) and Advanced Practice Providers (APPs -  Physician Assistants and Nurse Practitioners) who all work together to provide you with the care you need, when you need it.  . You will need a follow up appointment as needed  . Providers on your designated Care Team:   . Christopher Berge, NP . Ryan Dunn, PA-C . Jacquelyn Visser, PA-C  Any Other Special Instructions Will Be Listed Below (If Applicable).  COVID-19 Vaccine Information can be found at: [email or web address] For questions related to vaccine distribution or appointments, please email [email or web address] or call [phone].     

## 2020-11-07 ENCOUNTER — Encounter: Payer: Self-pay | Admitting: Obstetrics and Gynecology

## 2020-11-24 DIAGNOSIS — S8391XA Sprain of unspecified site of right knee, initial encounter: Secondary | ICD-10-CM | POA: Diagnosis not present

## 2021-07-09 ENCOUNTER — Other Ambulatory Visit: Payer: Self-pay | Admitting: Obstetrics and Gynecology

## 2021-07-09 DIAGNOSIS — Z1231 Encounter for screening mammogram for malignant neoplasm of breast: Secondary | ICD-10-CM

## 2021-07-12 DIAGNOSIS — Z13220 Encounter for screening for lipoid disorders: Secondary | ICD-10-CM | POA: Diagnosis not present

## 2021-07-12 DIAGNOSIS — Z6833 Body mass index (BMI) 33.0-33.9, adult: Secondary | ICD-10-CM | POA: Diagnosis not present

## 2021-07-12 DIAGNOSIS — Z713 Dietary counseling and surveillance: Secondary | ICD-10-CM | POA: Diagnosis not present

## 2021-07-12 DIAGNOSIS — Z136 Encounter for screening for cardiovascular disorders: Secondary | ICD-10-CM | POA: Diagnosis not present

## 2021-08-14 ENCOUNTER — Ambulatory Visit
Admission: RE | Admit: 2021-08-14 | Discharge: 2021-08-14 | Disposition: A | Discharge status: Home / Self Care | Payer: BC Managed Care – PPO | Source: Ambulatory Visit | Attending: Obstetrics and Gynecology | Admitting: Obstetrics and Gynecology

## 2021-08-14 DIAGNOSIS — Z1231 Encounter for screening mammogram for malignant neoplasm of breast: Secondary | ICD-10-CM | POA: Diagnosis not present

## 2021-08-15 ENCOUNTER — Other Ambulatory Visit: Payer: Self-pay | Admitting: Obstetrics and Gynecology

## 2021-08-15 DIAGNOSIS — R928 Other abnormal and inconclusive findings on diagnostic imaging of breast: Secondary | ICD-10-CM

## 2021-08-15 DIAGNOSIS — N6489 Other specified disorders of breast: Secondary | ICD-10-CM

## 2021-08-27 DIAGNOSIS — M9905 Segmental and somatic dysfunction of pelvic region: Secondary | ICD-10-CM | POA: Diagnosis not present

## 2021-08-27 DIAGNOSIS — M9903 Segmental and somatic dysfunction of lumbar region: Secondary | ICD-10-CM | POA: Diagnosis not present

## 2021-08-27 DIAGNOSIS — M62830 Muscle spasm of back: Secondary | ICD-10-CM | POA: Diagnosis not present

## 2021-08-30 DIAGNOSIS — M62830 Muscle spasm of back: Secondary | ICD-10-CM | POA: Diagnosis not present

## 2021-08-30 DIAGNOSIS — M9905 Segmental and somatic dysfunction of pelvic region: Secondary | ICD-10-CM | POA: Diagnosis not present

## 2021-08-30 DIAGNOSIS — M9903 Segmental and somatic dysfunction of lumbar region: Secondary | ICD-10-CM | POA: Diagnosis not present

## 2021-09-02 DIAGNOSIS — M9905 Segmental and somatic dysfunction of pelvic region: Secondary | ICD-10-CM | POA: Diagnosis not present

## 2021-09-02 DIAGNOSIS — M9903 Segmental and somatic dysfunction of lumbar region: Secondary | ICD-10-CM | POA: Diagnosis not present

## 2021-09-02 DIAGNOSIS — M62830 Muscle spasm of back: Secondary | ICD-10-CM | POA: Diagnosis not present

## 2021-09-02 DIAGNOSIS — M5416 Radiculopathy, lumbar region: Secondary | ICD-10-CM | POA: Diagnosis not present

## 2021-09-04 DIAGNOSIS — M9905 Segmental and somatic dysfunction of pelvic region: Secondary | ICD-10-CM | POA: Diagnosis not present

## 2021-09-04 DIAGNOSIS — M9903 Segmental and somatic dysfunction of lumbar region: Secondary | ICD-10-CM | POA: Diagnosis not present

## 2021-09-04 DIAGNOSIS — M5416 Radiculopathy, lumbar region: Secondary | ICD-10-CM | POA: Diagnosis not present

## 2021-09-04 DIAGNOSIS — M62830 Muscle spasm of back: Secondary | ICD-10-CM | POA: Diagnosis not present

## 2021-09-09 DIAGNOSIS — M9903 Segmental and somatic dysfunction of lumbar region: Secondary | ICD-10-CM | POA: Diagnosis not present

## 2021-09-09 DIAGNOSIS — M5416 Radiculopathy, lumbar region: Secondary | ICD-10-CM | POA: Diagnosis not present

## 2021-09-09 DIAGNOSIS — M9905 Segmental and somatic dysfunction of pelvic region: Secondary | ICD-10-CM | POA: Diagnosis not present

## 2021-09-09 DIAGNOSIS — M62830 Muscle spasm of back: Secondary | ICD-10-CM | POA: Diagnosis not present

## 2021-09-12 ENCOUNTER — Ambulatory Visit
Admission: RE | Admit: 2021-09-12 | Discharge: 2021-09-12 | Disposition: A | Discharge status: Home / Self Care | Payer: BC Managed Care – PPO | Source: Ambulatory Visit | Attending: Obstetrics and Gynecology | Admitting: Obstetrics and Gynecology

## 2021-09-12 DIAGNOSIS — N6489 Other specified disorders of breast: Secondary | ICD-10-CM

## 2021-09-12 DIAGNOSIS — R928 Other abnormal and inconclusive findings on diagnostic imaging of breast: Secondary | ICD-10-CM | POA: Diagnosis not present

## 2022-01-31 DIAGNOSIS — M7542 Impingement syndrome of left shoulder: Secondary | ICD-10-CM | POA: Diagnosis not present

## 2022-02-05 ENCOUNTER — Other Ambulatory Visit: Payer: Self-pay | Admitting: Obstetrics and Gynecology

## 2022-02-05 DIAGNOSIS — N63 Unspecified lump in unspecified breast: Secondary | ICD-10-CM

## 2022-02-05 DIAGNOSIS — R928 Other abnormal and inconclusive findings on diagnostic imaging of breast: Secondary | ICD-10-CM

## 2022-04-04 ENCOUNTER — Ambulatory Visit
Admission: RE | Admit: 2022-04-04 | Discharge: 2022-04-04 | Disposition: A | Discharge status: Home / Self Care | Payer: BC Managed Care – PPO | Source: Ambulatory Visit | Attending: Obstetrics and Gynecology | Admitting: Obstetrics and Gynecology

## 2022-04-04 DIAGNOSIS — N63 Unspecified lump in unspecified breast: Secondary | ICD-10-CM | POA: Insufficient documentation

## 2022-04-04 DIAGNOSIS — R92332 Mammographic heterogeneous density, left breast: Secondary | ICD-10-CM | POA: Diagnosis not present

## 2022-04-04 DIAGNOSIS — R928 Other abnormal and inconclusive findings on diagnostic imaging of breast: Secondary | ICD-10-CM | POA: Diagnosis not present

## 2022-04-04 DIAGNOSIS — N6002 Solitary cyst of left breast: Secondary | ICD-10-CM | POA: Diagnosis not present

## 2022-04-14 ENCOUNTER — Emergency Department: Payer: BC Managed Care – PPO

## 2022-04-14 ENCOUNTER — Other Ambulatory Visit: Payer: Self-pay

## 2022-04-14 ENCOUNTER — Emergency Department
Admission: EM | Admit: 2022-04-14 | Discharge: 2022-04-14 | Disposition: A | Discharge status: Home / Self Care | Payer: BC Managed Care – PPO | Attending: Emergency Medicine | Admitting: Emergency Medicine

## 2022-04-14 DIAGNOSIS — R42 Dizziness and giddiness: Secondary | ICD-10-CM | POA: Insufficient documentation

## 2022-04-14 DIAGNOSIS — R519 Headache, unspecified: Secondary | ICD-10-CM | POA: Diagnosis not present

## 2022-04-14 MED ORDER — MECLIZINE HCL 25 MG PO TABS
25.0000 mg | ORAL_TABLET | Freq: Once | ORAL | Status: AC
  Administered 2022-04-14: 25 mg via ORAL
  Filled 2022-04-14: qty 1

## 2022-04-14 MED ORDER — MECLIZINE HCL 25 MG PO TABS: 25.0000 mg | ORAL_TABLET | Freq: Three times a day (TID) | ORAL | 0 refills | Status: AC | PRN

## 2022-04-14 MED ORDER — ONDANSETRON 4 MG PO TBDP: 4.0000 mg | ORAL_TABLET | Freq: Three times a day (TID) | ORAL | 0 refills | Status: DC | PRN

## 2022-04-14 MED ORDER — ONDANSETRON 4 MG PO TBDP
4.0000 mg | ORAL_TABLET | Freq: Once | ORAL | Status: AC
  Administered 2022-04-14: 4 mg via ORAL
  Filled 2022-04-14: qty 1

## 2022-04-14 NOTE — ED Provider Notes (Signed)
[facility] Provider Note    Event Date/Time   First MD Initiated Contact with Patient 04/14/22 1026     (approximate)   History   Dizziness   HPI  Courtney Ali is a 46 y.o. female with no significant past medical history who presents with complaints of dizziness.  Patient reports she woke up yesterday onto the bathroom, turned her head and felt the room was spinning.  She laid back down and felt slightly better later in the day.  This occurred again today.  No history of vertigo although she reports it "runs in her family ".  No head injury.  No neurodeficits.  No change in vision.     Physical Exam   Triage Vital Signs: ED Triage Vitals  Enc Vitals Group     BP 04/14/22 0951 (!) 168/103     Pulse Rate 04/14/22 0951 76     Resp 04/14/22 0951 18     Temp 04/14/22 0951 98.3 F (36.8 C)     Temp Source 04/14/22 0951 Oral     SpO2 04/14/22 0951 96 %     Weight 04/14/22 0950 101.2 kg (223 lb)     Height 04/14/22 0950 1.74 m (5' 8.5")     Head Circumference --      Peak Flow --      Pain Score 04/14/22 0949 5     Pain Loc --      Pain Edu? --      Excl. in GC? --     Most recent vital signs: Vitals:   04/14/22 0951  BP: (!) 168/103  Pulse: 76  Resp: 18  Temp: 98.3 F (36.8 C)  SpO2: 96%     General: Awake, no distress.  CV:  Good peripheral perfusion.  Resp:  Normal effort.  Abd:  No distention.  Other:  PERRLA, left-sided nystagmus, normal neuroexam   ED Results / Procedures / Treatments   Labs (all labs ordered are listed, but only abnormal results are displayed) Labs Reviewed - No data to display   EKG     RADIOLOGY CT head viewed interpreted by me, no ICH    PROCEDURES:  Critical Care performed:   Procedures   MEDICATIONS ORDERED IN ED: Medications  ondansetron (ZOFRAN-ODT) disintegrating tablet 4 mg (4 mg Oral Given 04/14/22 1052)  meclizine (ANTIVERT) tablet 25 mg (25 mg Oral Given 04/14/22 1052)      IMPRESSION / MDM / ASSESSMENT AND PLAN / ED COURSE  I reviewed the triage vital signs and the nursing notes. Patient's presentation is most consistent with acute complicated illness / injury requiring diagnostic workup.   Patient presents with vertigo as described above, differential includes BPV, less likely central cause,   CT head performed and is reassuring, patient treated with meclizine and Zofran with improvement, she already has ENT follow-up, no indication for admission at this time.       FINAL CLINICAL IMPRESSION(S) / ED DIAGNOSES   Final diagnoses:  Vertigo     Rx / DC Orders   ED Discharge Orders          Ordered    meclizine (ANTIVERT) 25 MG tablet  3 times daily PRN        04/14/22 1207    ondansetron (ZOFRAN-ODT) 4 MG disintegrating tablet  Every 8 hours PRN        04/14/22 1207             Note:  This document was prepared using Dragon voice recognition software and may include unintentional dictation errors.   Jene Every, MD 04/14/22 1626

## 2022-04-14 NOTE — ED Triage Notes (Signed)
Reports vertigo since Friday.  Reports worse with movement and worse lying on left side.  Denies any other neuro symptoms.  Complains of frontal headache and nausea.

## 2022-04-18 DIAGNOSIS — R42 Dizziness and giddiness: Secondary | ICD-10-CM | POA: Diagnosis not present

## 2022-04-28 DIAGNOSIS — Z8249 Family history of ischemic heart disease and other diseases of the circulatory system: Secondary | ICD-10-CM | POA: Diagnosis not present

## 2022-04-28 DIAGNOSIS — I10 Essential (primary) hypertension: Secondary | ICD-10-CM | POA: Diagnosis not present

## 2022-04-28 DIAGNOSIS — R002 Palpitations: Secondary | ICD-10-CM | POA: Diagnosis not present

## 2022-04-28 DIAGNOSIS — Z131 Encounter for screening for diabetes mellitus: Secondary | ICD-10-CM | POA: Diagnosis not present

## 2022-05-06 ENCOUNTER — Ambulatory Visit: Payer: BC Managed Care – PPO | Admitting: Nurse Practitioner

## 2022-05-08 DIAGNOSIS — R002 Palpitations: Secondary | ICD-10-CM | POA: Diagnosis not present

## 2022-05-13 DIAGNOSIS — R002 Palpitations: Secondary | ICD-10-CM | POA: Diagnosis not present

## 2022-05-13 DIAGNOSIS — Z8249 Family history of ischemic heart disease and other diseases of the circulatory system: Secondary | ICD-10-CM | POA: Diagnosis not present

## 2022-05-13 DIAGNOSIS — R42 Dizziness and giddiness: Secondary | ICD-10-CM | POA: Diagnosis not present

## 2022-05-13 DIAGNOSIS — I10 Essential (primary) hypertension: Secondary | ICD-10-CM | POA: Diagnosis not present

## 2022-05-13 DIAGNOSIS — Z131 Encounter for screening for diabetes mellitus: Secondary | ICD-10-CM | POA: Diagnosis not present

## 2022-06-17 DIAGNOSIS — Z136 Encounter for screening for cardiovascular disorders: Secondary | ICD-10-CM | POA: Diagnosis not present

## 2022-06-17 DIAGNOSIS — Z6833 Body mass index (BMI) 33.0-33.9, adult: Secondary | ICD-10-CM | POA: Diagnosis not present

## 2022-06-17 DIAGNOSIS — Z713 Dietary counseling and surveillance: Secondary | ICD-10-CM | POA: Diagnosis not present

## 2022-06-17 DIAGNOSIS — Z13220 Encounter for screening for lipoid disorders: Secondary | ICD-10-CM | POA: Diagnosis not present

## 2022-06-24 ENCOUNTER — Ambulatory Visit: Payer: Self-pay | Admitting: Internal Medicine

## 2022-08-20 DIAGNOSIS — M47896 Other spondylosis, lumbar region: Secondary | ICD-10-CM | POA: Diagnosis not present

## 2022-08-20 DIAGNOSIS — S39012A Strain of muscle, fascia and tendon of lower back, initial encounter: Secondary | ICD-10-CM | POA: Diagnosis not present

## 2022-09-09 DIAGNOSIS — R262 Difficulty in walking, not elsewhere classified: Secondary | ICD-10-CM | POA: Diagnosis not present

## 2022-09-09 DIAGNOSIS — M5451 Vertebrogenic low back pain: Secondary | ICD-10-CM | POA: Diagnosis not present

## 2022-09-11 ENCOUNTER — Other Ambulatory Visit: Payer: Self-pay | Admitting: Internal Medicine

## 2022-09-11 DIAGNOSIS — I10 Essential (primary) hypertension: Secondary | ICD-10-CM

## 2022-09-15 ENCOUNTER — Other Ambulatory Visit: Payer: Self-pay

## 2022-09-15 DIAGNOSIS — I10 Essential (primary) hypertension: Secondary | ICD-10-CM

## 2022-09-15 MED ORDER — LISINOPRIL 5 MG PO TABS: 5.0000 mg | ORAL_TABLET | Freq: Every day | ORAL | 1 refills | Status: DC

## 2022-09-16 DIAGNOSIS — R262 Difficulty in walking, not elsewhere classified: Secondary | ICD-10-CM | POA: Diagnosis not present

## 2022-09-16 DIAGNOSIS — M5451 Vertebrogenic low back pain: Secondary | ICD-10-CM | POA: Diagnosis not present

## 2022-09-23 DIAGNOSIS — R262 Difficulty in walking, not elsewhere classified: Secondary | ICD-10-CM | POA: Diagnosis not present

## 2022-09-23 DIAGNOSIS — M5451 Vertebrogenic low back pain: Secondary | ICD-10-CM | POA: Diagnosis not present

## 2023-02-24 DIAGNOSIS — M25552 Pain in left hip: Secondary | ICD-10-CM | POA: Diagnosis not present

## 2023-02-24 DIAGNOSIS — M25652 Stiffness of left hip, not elsewhere classified: Secondary | ICD-10-CM | POA: Diagnosis not present

## 2023-02-24 DIAGNOSIS — M5459 Other low back pain: Secondary | ICD-10-CM | POA: Diagnosis not present

## 2023-03-03 DIAGNOSIS — M25652 Stiffness of left hip, not elsewhere classified: Secondary | ICD-10-CM | POA: Diagnosis not present

## 2023-03-03 DIAGNOSIS — M5459 Other low back pain: Secondary | ICD-10-CM | POA: Diagnosis not present

## 2023-03-03 DIAGNOSIS — M25552 Pain in left hip: Secondary | ICD-10-CM | POA: Diagnosis not present

## 2023-03-05 DIAGNOSIS — M5459 Other low back pain: Secondary | ICD-10-CM | POA: Diagnosis not present

## 2023-03-05 DIAGNOSIS — M25552 Pain in left hip: Secondary | ICD-10-CM | POA: Diagnosis not present

## 2023-03-05 DIAGNOSIS — M25652 Stiffness of left hip, not elsewhere classified: Secondary | ICD-10-CM | POA: Diagnosis not present

## 2023-03-10 DIAGNOSIS — H8112 Benign paroxysmal vertigo, left ear: Secondary | ICD-10-CM | POA: Diagnosis not present

## 2023-03-30 ENCOUNTER — Other Ambulatory Visit: Payer: Self-pay | Admitting: Internal Medicine

## 2023-03-30 DIAGNOSIS — I10 Essential (primary) hypertension: Secondary | ICD-10-CM

## 2023-04-21 DIAGNOSIS — H8112 Benign paroxysmal vertigo, left ear: Secondary | ICD-10-CM | POA: Diagnosis not present

## 2023-07-27 NOTE — Progress Notes (Unsigned)
 GYNECOLOGY ANNUAL PHYSICAL EXAM PROGRESS NOTE  Subjective:    Courtney Ali is a 48 y.o. G33P2003 female who presents for an annual exam.  The patient {is/is not/has never been:13135} sexually active. The patient participates in regular exercise: {yes/no/not asked:9010}. Has the patient ever been transfused or tattooed?: {yes/no/not asked:9010}. The patient reports that there {is/is not:9024} domestic violence in her life.   The patient has the following complaints today:   Menstrual History: Menarche age: *** No LMP recorded. (Menstrual status: IUD).     Gynecologic History:  Contraception: {method:5051} History of STI's:  Last Pap: 04/06/2018. Results were: normal.  ***Denies/Notes h/o abnormal pap smears. Last mammogram: 08/14/2021. Results were: abnormal, Diagnostic mammogram and possibly ultrasound of the left breast.        OB History  Gravida Para Term Preterm AB Living  2 2 2  0 0 3  SAB IAB Ectopic Multiple Live Births  0 0 0 1 3    # Outcome Date GA Lbr Len/2nd Weight Sex Type Anes PTL Lv  2A Term     F Vag-Spont   LIV  2B Term     F Vag-Spont   LIV  1 Term    9 lb 9.6 oz (4.355 kg) F Vag-Spont   LIV    Past Medical History:  Diagnosis Date   Hypertension    Increased BMI    PIH (pregnancy induced hypertension)    Post partum depression    Vaginal atrophy    breastfeeding induced    Past Surgical History:  Procedure Laterality Date   artificial inseminaiton  04/2012    Family History  Problem Relation Age of Onset   Diabetes Paternal Grandmother    Healthy Mother    Hypertension Father    Hypertension Brother    Atrial fibrillation Maternal Aunt    Heart disease Neg Hx    Breast cancer Neg Hx    Colon cancer Neg Hx    Ovarian cancer Neg Hx    Cancer Neg Hx     Social History   Socioeconomic History   Marital status: Married    Spouse name: Not on file   Number of children: Not on file   Years of education: Not on file    Highest education level: Not on file  Occupational History   Not on file  Tobacco Use   Smoking status: Never   Smokeless tobacco: Never  Vaping Use   Vaping status: Never Used  Substance and Sexual Activity   Alcohol use: Yes    Comment: socially   Drug use: No   Sexual activity: Yes    Birth control/protection: I.U.D.  Other Topics Concern   Not on file  Social History Narrative   Not on file   Social Drivers of Health   Financial Resource Strain: Not on file  Food Insecurity: Not on file  Transportation Needs: Not on file  Physical Activity: Sufficiently Active (04/02/2017)   Exercise Vital Sign    Days of Exercise per Week: 3 days    Minutes of Exercise per Session: 60 min  Stress: Not on file  Social Connections: Not on file  Intimate Partner Violence: Not At Risk (04/02/2017)   Humiliation, Afraid, Rape, and Kick questionnaire    Fear of Current or Ex-Partner: No    Emotionally Abused: No    Physically Abused: No    Sexually Abused: No    Current Outpatient Medications on File Prior to Visit  Medication  Sig Dispense Refill   levonorgestrel (MIRENA) 20 MCG/24HR IUD 1 each by Intrauterine route once.     lisinopril (ZESTRIL) 5 MG tablet TAKE 1 TABLET (5 MG TOTAL) BY MOUTH DAILY. 90 tablet 1   meclizine (ANTIVERT) 25 MG tablet Take 1 tablet (25 mg total) by mouth 3 (three) times daily as needed for dizziness. 20 tablet 0   ondansetron (ZOFRAN-ODT) 4 MG disintegrating tablet Take 1 tablet (4 mg total) by mouth every 8 (eight) hours as needed for nausea or vomiting. 20 tablet 0   No current facility-administered medications on file prior to visit.    No Known Allergies   Review of Systems Constitutional: negative for chills, fatigue, fevers and sweats Eyes: negative for irritation, redness and visual disturbance Ears, nose, mouth, throat, and face: negative for hearing loss, nasal congestion, snoring and tinnitus Respiratory: negative for asthma, cough,  sputum Cardiovascular: negative for chest pain, dyspnea, exertional chest pressure/discomfort, irregular heart beat, palpitations and syncope Gastrointestinal: negative for abdominal pain, change in bowel habits, nausea and vomiting Genitourinary: negative for abnormal menstrual periods, genital lesions, sexual problems and vaginal discharge, dysuria and urinary incontinence Integument/breast: negative for breast lump, breast tenderness and nipple discharge Hematologic/lymphatic: negative for bleeding and easy bruising Musculoskeletal:negative for back pain and muscle weakness Neurological: negative for dizziness, headaches, vertigo and weakness Endocrine: negative for diabetic symptoms including polydipsia, polyuria and skin dryness Allergic/Immunologic: negative for hay fever and urticaria      Objective:  There were no vitals taken for this visit. There is no height or weight on file to calculate BMI.    General Appearance:    Alert, cooperative, no distress, appears stated age  Head:    Normocephalic, without obvious abnormality, atraumatic  Eyes:    PERRL, conjunctiva/corneas clear, EOM's intact, both eyes  Ears:    Normal external ear canals, both ears  Nose:   Nares normal, septum midline, mucosa normal, no drainage or sinus tenderness  Throat:   Lips, mucosa, and tongue normal; teeth and gums normal  Neck:   Supple, symmetrical, trachea midline, no adenopathy; thyroid: no enlargement/tenderness/nodules; no carotid bruit or JVD  Back:     Symmetric, no curvature, ROM normal, no CVA tenderness  Lungs:     Clear to auscultation bilaterally, respirations unlabored  Chest Wall:    No tenderness or deformity   Heart:    Regular rate and rhythm, S1 and S2 normal, no murmur, rub or gallop  Breast Exam:    No tenderness, masses, or nipple abnormality  Abdomen:     Soft, non-tender, bowel sounds active all four quadrants, no masses, no organomegaly.    Genitalia:    Pelvic:external  genitalia normal, vagina without lesions, discharge, or tenderness, rectovaginal septum  normal. Cervix normal in appearance, no cervical motion tenderness, no adnexal masses or tenderness.  Uterus normal size, shape, mobile, regular contours, nontender.  Rectal:    Normal external sphincter.  No hemorrhoids appreciated. Internal exam not done.   Extremities:   Extremities normal, atraumatic, no cyanosis or edema  Pulses:   2+ and symmetric all extremities  Skin:   Skin color, texture, turgor normal, no rashes or lesions  Lymph nodes:   Cervical, supraclavicular, and axillary nodes normal  Neurologic:   CNII-XII intact, normal strength, sensation and reflexes throughout   .  Labs:  Lab Results  Component Value Date   WBC 7.1 04/25/2020   HGB 14.3 04/25/2020   HCT 41.5 04/25/2020   MCV 88 04/25/2020  PLT 254 04/25/2020    Lab Results  Component Value Date   CREATININE 0.74 04/25/2020   BUN 8 04/25/2020   NA 138 04/25/2020   K 4.4 04/25/2020   CL 104 04/25/2020   CO2 21 04/25/2020    Lab Results  Component Value Date   ALT 25 04/25/2020   AST 19 04/25/2020   ALKPHOS 82 04/25/2020   BILITOT 0.5 04/25/2020    Lab Results  Component Value Date   TSH 1.040 04/25/2020     Assessment:   No diagnosis found.   Plan:  Blood tests: {blood tests:13147}. Breast self exam technique reviewed and patient encouraged to perform self-exam monthly. Contraception: {contraceptive methods:5051}. Discussed healthy lifestyle modifications. Mammogram {discussed/ordered:14545} Pap smear {discussed/ordered:14545}. Flu vaccine: Follow up in 1 year for annual exam   Tommie Raymond, CMA [facility]

## 2023-07-28 ENCOUNTER — Ambulatory Visit (INDEPENDENT_AMBULATORY_CARE_PROVIDER_SITE_OTHER): Payer: BC Managed Care – PPO | Admitting: Obstetrics and Gynecology

## 2023-07-28 ENCOUNTER — Encounter: Payer: Self-pay | Admitting: Obstetrics and Gynecology

## 2023-07-28 ENCOUNTER — Other Ambulatory Visit (HOSPITAL_COMMUNITY)
Admission: RE | Admit: 2023-07-28 | Discharge: 2023-07-28 | Disposition: A | Discharge status: Home / Self Care | Source: Ambulatory Visit | Attending: Obstetrics and Gynecology | Admitting: Obstetrics and Gynecology

## 2023-07-28 VITALS — BP 161/109 | HR 83 | Resp 16 | Ht 68.5 in | Wt 216.8 lb

## 2023-07-28 DIAGNOSIS — R638 Other symptoms and signs concerning food and fluid intake: Secondary | ICD-10-CM

## 2023-07-28 DIAGNOSIS — Z124 Encounter for screening for malignant neoplasm of cervix: Secondary | ICD-10-CM | POA: Diagnosis not present

## 2023-07-28 DIAGNOSIS — Z1231 Encounter for screening mammogram for malignant neoplasm of breast: Secondary | ICD-10-CM

## 2023-07-28 DIAGNOSIS — Z1211 Encounter for screening for malignant neoplasm of colon: Secondary | ICD-10-CM

## 2023-07-28 DIAGNOSIS — Z01419 Encounter for gynecological examination (general) (routine) without abnormal findings: Secondary | ICD-10-CM

## 2023-07-28 DIAGNOSIS — E785 Hyperlipidemia, unspecified: Secondary | ICD-10-CM | POA: Diagnosis not present

## 2023-07-28 DIAGNOSIS — R928 Other abnormal and inconclusive findings on diagnostic imaging of breast: Secondary | ICD-10-CM

## 2023-07-28 DIAGNOSIS — Z131 Encounter for screening for diabetes mellitus: Secondary | ICD-10-CM

## 2023-07-29 LAB — COMPREHENSIVE METABOLIC PANEL
ALT: 29 IU/L (ref 0–32)
AST: 21 IU/L (ref 0–40)
Albumin: 4.4 g/dL (ref 3.9–4.9)
Alkaline Phosphatase: 95 IU/L (ref 44–121)
BUN/Creatinine Ratio: 12 (ref 9–23)
BUN: 9 mg/dL (ref 6–24)
Bilirubin Total: 0.4 mg/dL (ref 0.0–1.2)
CO2: 22 mmol/L (ref 20–29)
Calcium: 9.7 mg/dL (ref 8.7–10.2)
Chloride: 105 mmol/L (ref 96–106)
Creatinine, Ser: 0.76 mg/dL (ref 0.57–1.00)
Globulin, Total: 2.2 g/dL (ref 1.5–4.5)
Glucose: 81 mg/dL (ref 70–99)
Potassium: 4.2 mmol/L (ref 3.5–5.2)
Sodium: 142 mmol/L (ref 134–144)
Total Protein: 6.6 g/dL (ref 6.0–8.5)
eGFR: 98 mL/min/{1.73_m2} (ref >59)

## 2023-07-29 LAB — CBC
Hematocrit: 44 % (ref 34.0–46.6)
Hemoglobin: 14.8 g/dL (ref 11.1–15.9)
MCH: 30.5 pg (ref 26.6–33.0)
MCHC: 33.6 g/dL (ref 31.5–35.7)
MCV: 91 fL (ref 79–97)
Platelets: 263 10*3/uL (ref 150–450)
RBC: 4.85 x10E6/uL (ref 3.77–5.28)
RDW: 12.7 % (ref 11.7–15.4)
WBC: 9 10*3/uL (ref 3.4–10.8)

## 2023-07-29 LAB — TSH: TSH: 0.941 u[IU]/mL (ref 0.450–4.500)

## 2023-07-29 LAB — LIPID PANEL
Chol/HDL Ratio: 5.2 ratio — ABNORMAL HIGH (ref 0.0–4.4)
Cholesterol, Total: 199 mg/dL (ref 100–199)
HDL: 38 mg/dL — ABNORMAL LOW (ref >39)
LDL Chol Calc (NIH): 129 mg/dL — ABNORMAL HIGH (ref 0–99)
Triglycerides: 181 mg/dL — ABNORMAL HIGH (ref 0–149)
VLDL Cholesterol Cal: 32 mg/dL (ref 5–40)

## 2023-07-29 LAB — HEMOGLOBIN A1C
Est. average glucose Bld gHb Est-mCnc: 105 mg/dL
Hgb A1c MFr Bld: 5.3 % (ref 4.8–5.6)

## 2023-07-31 LAB — CYTOLOGY - PAP
Comment: NEGATIVE
Diagnosis: NEGATIVE
High risk HPV: NEGATIVE

## 2023-08-03 ENCOUNTER — Encounter: Payer: Self-pay | Admitting: Obstetrics and Gynecology

## 2023-08-20 ENCOUNTER — Ambulatory Visit
Admission: RE | Admit: 2023-08-20 | Discharge: 2023-08-20 | Disposition: A | Discharge status: Home / Self Care | Source: Ambulatory Visit | Attending: Obstetrics and Gynecology | Admitting: Obstetrics and Gynecology

## 2023-08-20 DIAGNOSIS — Z1231 Encounter for screening mammogram for malignant neoplasm of breast: Secondary | ICD-10-CM | POA: Insufficient documentation

## 2023-08-22 ENCOUNTER — Encounter: Payer: Self-pay | Admitting: Obstetrics and Gynecology

## 2023-10-08 NOTE — Progress Notes (Signed)
 BP 126/64 (BP Location: Left Arm, Patient Position: Sitting, Cuff Size: Normal)   Pulse 90   Temp 98.1 F (36.7 C) (Oral)   Ht 5' 8.25" (1.734 m)   Wt 214 lb 4.8 oz (97.2 kg)   SpO2 99%   BMI 32.35 kg/m    Subjective:    Patient ID: Courtney Ali, female    DOB: 07-05-1975, 48 y.o.   MRN: [id number]  HPI: Courtney Ali is a 48 y.o. female  Chief Complaint  Patient presents with   Establish Care    Sees [facility] [place]    Hypertension    Discuss lisinopril , patient thinks hr blood pressure is still running high    Dizziness    Discuss vertigo and meclizine  she is taking     Discussed the use of AI scribe software for clinical note transcription with the patient, who gave verbal consent to proceed.  History of Present Illness Courtney Ali is a 48 year old female with hypertension and hyperlipidemia who presents to establish care.  She has a history of hypertension that began after the birth of her twins. She is currently taking lisinopril  5 mg daily, which she has been on for about two years. She attempted to stop the medication after losing weight but experienced severe headaches, dizziness, and high blood pressure readings, including one instance of 190/110 mmHg. She resumed the medication and her blood pressure readings have been within the normal range recently.  She experiences episodes of vertigo, described as severe dizziness, particularly when lying on her left side or back, or when tilting her head back. The first major episode occurred around Thanksgiving a year ago, with symptoms of spinning, headache, and nausea. A CT scan was normal. She uses meclizine  as needed, though it causes significant drowsiness. She has not seen an ENT doctor or neurologist but has performed the Epley maneuver at home with some relief. Her mother also experienced severe vertigo.  She has hyperlipidemia, with a recent lipid panel showing an LDL of 129 mg/dL. Her family  history is significant for heart disease, with her grandmother having died of a heart attack at 7, and her father and brother having hypertension.  She reports no significant issues with sleep, though she snores, which has improved with weight loss. 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 She has no significant issues with daily activities, though she must be cautious with certain movements to avoid dizziness.     The 10-year ASCVD risk score (Arnett DK, et al., 2019) is: 2%   Values used to calculate the score:     Age: 89 years     Sex: Female     Is Non-Hispanic African American: No     Diabetic: No     Tobacco smoker: No     Systolic Blood Pressure: 126 mmHg     Is BP treated: Yes     HDL Cholesterol: 38 mg/dL     Total Cholesterol: 199 mg/dL     08/25/8117    1:47 PM 07/28/2023    9:36 AM  Depression screen PHQ 2/9  Decreased Interest 0 0  Down, Depressed, Hopeless 0 0  PHQ - 2 Score 0 0  Altered sleeping 0   Tired, decreased energy 0   Change in appetite 0   Feeling bad or failure about yourself  0   Trouble concentrating 0   Moving slowly or fidgety/restless 0   Suicidal thoughts  0   PHQ-9 Score 0   Difficult doing work/chores Not difficult at all     Relevant past medical, surgical, family and social history reviewed and updated as indicated. Interim medical history since our last visit reviewed. Allergies and medications reviewed and updated.  Review of Systems  Constitutional: Negative for fever or weight change.  Respiratory: Negative for cough and shortness of breath.   Cardiovascular: Negative for chest pain or palpitations.  Gastrointestinal: Negative for abdominal pain, no bowel changes.  Musculoskeletal: Negative for gait problem or joint swelling.  Skin: Negative for rash.  Neurological: positive for dizziness and headache.  No other specific complaints in a complete review of systems (except as listed in HPI above).       Objective:      BP 126/64 (BP Location: Left Arm, Patient Position: Sitting, Cuff Size: Normal)   Pulse 90   Temp 98.1 F (36.7 C) (Oral)   Ht 5' 8.25" (1.734 m)   Wt 214 lb 4.8 oz (97.2 kg)   SpO2 99%   BMI 32.35 kg/m    Wt Readings from Last 3 Encounters:  10/09/23 214 lb 4.8 oz (97.2 kg)  07/28/23 216 lb 12.8 oz (98.3 kg)  04/14/22 223 lb (101.2 kg)    Physical Exam Vitals reviewed.  Constitutional:      Appearance: Normal appearance.  HENT:     Head: Normocephalic.  Cardiovascular:     Rate and Rhythm: Normal rate and regular rhythm.  Pulmonary:     Effort: Pulmonary effort is normal.     Breath sounds: Normal breath sounds.  Musculoskeletal:        General: Normal range of motion.  Skin:    General: Skin is warm and dry.  Neurological:     General: No focal deficit present.     Mental Status: She is alert and oriented to person, place, and time. Mental status is at baseline.     Cranial Nerves: Cranial nerves 2-12 are intact.     Sensory: Sensation is intact.     Motor: Motor function is intact.     Coordination: Coordination is intact.  Psychiatric:        Mood and Affect: Mood normal.        Behavior: Behavior normal.        Thought Content: Thought content normal.        Judgment: Judgment normal.    Physical Exam    Results for orders placed or performed in visit on 07/28/23  Cytology - PAP   Collection Time: 07/28/23  9:43 AM  Result Value Ref Range   High risk HPV Negative    Adequacy      Satisfactory for evaluation; transformation zone component PRESENT.   Diagnosis      - Negative for intraepithelial lesion or malignancy (NILM)   Comment Normal Reference Range HPV - Negative   CBC   Collection Time: 07/28/23 10:55 AM  Result Value Ref Range   WBC 9.0 3.4 - 10.8 x10E3/uL   RBC 4.85 3.77 - 5.28 x10E6/uL   Hemoglobin 14.8 11.1 - 15.9 g/dL   Hematocrit 16.1 09.6 - 46.6 %   MCV 91 79 - 97 fL   MCH 30.5 26.6 - 33.0 pg   MCHC 33.6  31.5 - 35.7 g/dL   RDW 04.5 40.9 - 81.1 %   Platelets 263 150 - 450 x10E3/uL  Comprehensive metabolic panel   Collection Time: 07/28/23 10:55 AM  Result Value Ref Range  Glucose 81 70 - 99 mg/dL   BUN 9 6 - 24 mg/dL   Creatinine, Ser 9.14 0.57 - 1.00 mg/dL   eGFR 98 >78 GN/FAO/1.30   BUN/Creatinine Ratio 12 9 - 23   Sodium 142 134 - 144 mmol/L   Potassium 4.2 3.5 - 5.2 mmol/L   Chloride 105 96 - 106 mmol/L   CO2 22 20 - 29 mmol/L   Calcium 9.7 8.7 - 10.2 mg/dL   Total Protein 6.6 6.0 - 8.5 g/dL   Albumin 4.4 3.9 - 4.9 g/dL   Globulin, Total 2.2 1.5 - 4.5 g/dL   Bilirubin Total 0.4 0.0 - 1.2 mg/dL   Alkaline Phosphatase 95 44 - 121 IU/L   AST 21 0 - 40 IU/L   ALT 29 0 - 32 IU/L  Hemoglobin A1c   Collection Time: 07/28/23 10:55 AM  Result Value Ref Range   Hgb A1c MFr Bld 5.3 4.8 - 5.6 %   Est. average glucose Bld gHb Est-mCnc 105 mg/dL  Lipid panel   Collection Time: 07/28/23 10:55 AM  Result Value Ref Range   Cholesterol, Total 199 100 - 199 mg/dL   Triglycerides 865 (H) 0 - 149 mg/dL   HDL 38 (L) >78 mg/dL   VLDL Cholesterol Cal 32 5 - 40 mg/dL   LDL Chol Calc (NIH) 469 (H) 0 - 99 mg/dL   Chol/HDL Ratio 5.2 (H) 0.0 - 4.4 ratio  TSH   Collection Time: 07/28/23 10:55 AM  Result Value Ref Range   TSH 0.941 0.450 - 4.500 uIU/mL          Assessment & Plan:   Problem List Items Addressed This Visit       Cardiovascular and Mediastinum   Hypertension - Primary     Other   Hyperlipidemia   Other Visit Diagnoses       Vertigo       Relevant Orders   Ambulatory referral to Neurology     Immunization due       Relevant Orders   Tdap vaccine greater than or equal to 7yo IM (Completed)        Assessment and Plan Assessment & Plan vertigo BPPV with vertigo, dizziness, and nausea, especially when lying on the left side or back. Symptoms persist for a year with severe episodes requiring meclizine . Previous ENT evaluations, including Epley maneuvers, provided  no significant relief. Neurology referral is advised for further evaluation due to severity and persistence of symptoms. Differential diagnosis includes other neurological causes of vertigo. - Refer to neurology for further evaluation. - Continue meclizine  as needed for severe episodes.  Hypertension Hypertension, likely familial, with episodes of elevated blood pressure. Previously controlled on lisinopril  5 mg daily. Recent elevated readings up to 190/110 mmHg when not medicated. Currently well-controlled on lisinopril . - Continue lisinopril  5 mg daily. - Monitor blood pressure regularly. - Contact provider for medication refill when needed.  Hyperlipidemia Hyperlipidemia with recent LDL of 129 mg/dL. A1c is 5.3, indicating no diabetes. Calculated 10-year cardiovascular risk score is 2%, which is low. No immediate need for cholesterol medication. - Monitor lipid levels and cardiovascular risk score.  General Health Maintenance Cologuard test kit provided for colorectal cancer screening. Discussed differences between Cologuard and colonoscopy. Cologuard is 99.9% accurate and suitable given no significant family history of colon cancer. Mammogram and Pap are up to date. Tetanus vaccination discussed and agreed upon. - Complete Cologuard test as instructed. - Administer tetanus vaccination. - Continue routine health screenings as scheduled.  Follow up plan: Return in about 6 months (around 04/10/2024) for follow up.

## 2023-10-09 ENCOUNTER — Ambulatory Visit: Admitting: Nurse Practitioner

## 2023-10-09 ENCOUNTER — Encounter: Payer: Self-pay | Admitting: Nurse Practitioner

## 2023-10-09 VITALS — BP 126/64 | HR 90 | Temp 98.1°F | Ht 68.25 in | Wt 214.3 lb

## 2023-10-09 DIAGNOSIS — Z23 Encounter for immunization: Secondary | ICD-10-CM

## 2023-10-09 DIAGNOSIS — R42 Dizziness and giddiness: Secondary | ICD-10-CM | POA: Diagnosis not present

## 2023-10-09 DIAGNOSIS — E785 Hyperlipidemia, unspecified: Secondary | ICD-10-CM | POA: Diagnosis not present

## 2023-10-09 DIAGNOSIS — I10 Essential (primary) hypertension: Secondary | ICD-10-CM | POA: Diagnosis not present

## 2023-10-20 ENCOUNTER — Encounter: Payer: Self-pay | Admitting: Nurse Practitioner

## 2023-10-21 ENCOUNTER — Other Ambulatory Visit: Payer: Self-pay | Admitting: Nurse Practitioner

## 2023-10-21 DIAGNOSIS — T753XXA Motion sickness, initial encounter: Secondary | ICD-10-CM

## 2023-10-21 MED ORDER — SCOPOLAMINE 1 MG/3DAYS TD PT72: 1.0000 | MEDICATED_PATCH | TRANSDERMAL | 0 refills | Status: DC

## 2023-11-17 ENCOUNTER — Other Ambulatory Visit: Payer: Self-pay | Admitting: Nurse Practitioner

## 2023-11-17 DIAGNOSIS — T753XXA Motion sickness, initial encounter: Secondary | ICD-10-CM

## 2023-11-18 NOTE — Telephone Encounter (Signed)
 Requested medication (s) are due for refill today: yes  Requested medication (s) are on the active medication list: yes  Last refill:  10/21/23  Future visit scheduled: yes  Notes to clinic:   Medication not assigned to a protocol, review manually      Requested Prescriptions  Pending Prescriptions Disp Refills   scopolamine  (TRANSDERM-SCOP) 1 MG/3DAYS [Pharmacy Med Name: SCOPOLAMINE  1 MG/3 DAY PATCH] 10 patch 0    Sig: PLACE 1 PATCH ONTO THE SKIN EVERY 3 DAYS.     Off-Protocol Failed - 11/18/2023  2:04 PM      Failed - Medication not assigned to a protocol, review manually.      Passed - Valid encounter within last 12 months    Recent Outpatient Visits           1 month ago Primary hypertension   [facility] Gareth Mliss FALCON, FNP       Future Appointments             In 5 months Gareth, Mliss FALCON, FNP [facility]

## 2023-12-01 ENCOUNTER — Other Ambulatory Visit: Payer: Self-pay | Admitting: Internal Medicine

## 2023-12-01 DIAGNOSIS — I10 Essential (primary) hypertension: Secondary | ICD-10-CM

## 2023-12-03 ENCOUNTER — Ambulatory Visit: Admitting: Nurse Practitioner

## 2023-12-08 ENCOUNTER — Other Ambulatory Visit: Payer: Self-pay | Admitting: Nurse Practitioner

## 2023-12-08 DIAGNOSIS — I10 Essential (primary) hypertension: Secondary | ICD-10-CM

## 2024-01-04 DIAGNOSIS — E538 Deficiency of other specified B group vitamins: Secondary | ICD-10-CM | POA: Diagnosis not present

## 2024-01-04 DIAGNOSIS — H8113 Benign paroxysmal vertigo, bilateral: Secondary | ICD-10-CM | POA: Diagnosis not present

## 2024-01-04 DIAGNOSIS — E559 Vitamin D deficiency, unspecified: Secondary | ICD-10-CM | POA: Diagnosis not present

## 2024-01-31 IMAGING — MG MM DIGITAL SCREENING BILAT W/ TOMO AND CAD
8 series · 8 of 24 positions shown · non-contrast
Comparison: Previous exam(s).

CLINICAL DATA: Screening.

EXAM:
DIGITAL SCREENING BILATERAL MAMMOGRAM WITH TOMOSYNTHESIS AND CAD
TECHNIQUE: Bilateral screening digital craniocaudal and mediolateral oblique
mammograms were obtained. Bilateral screening digital breast
tomosynthesis was performed. The images were evaluated with
computer-aided detection.

[L MLO synth-2D]
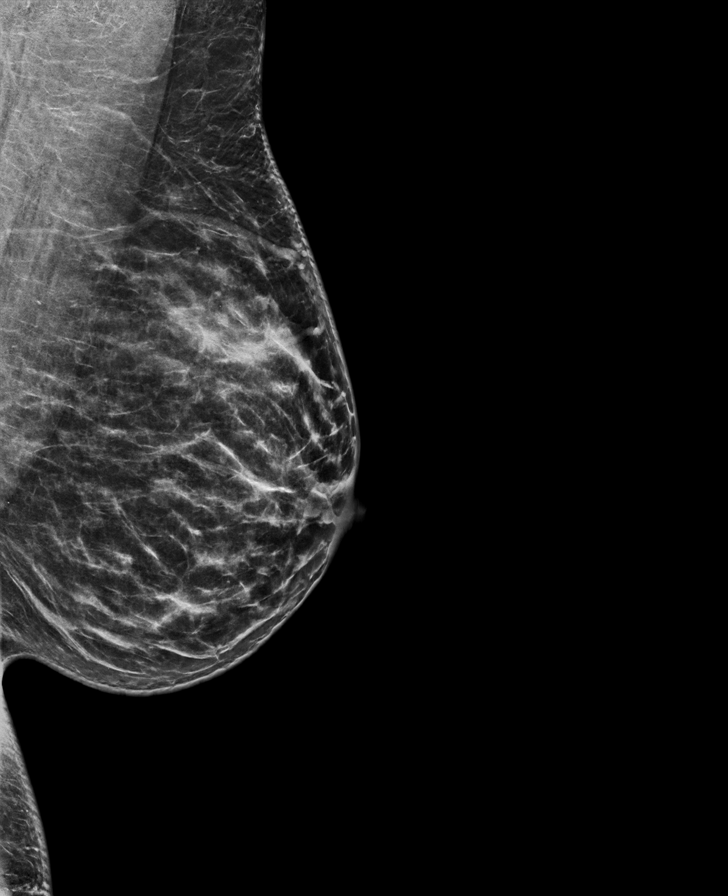

[R MLO synth-2D]
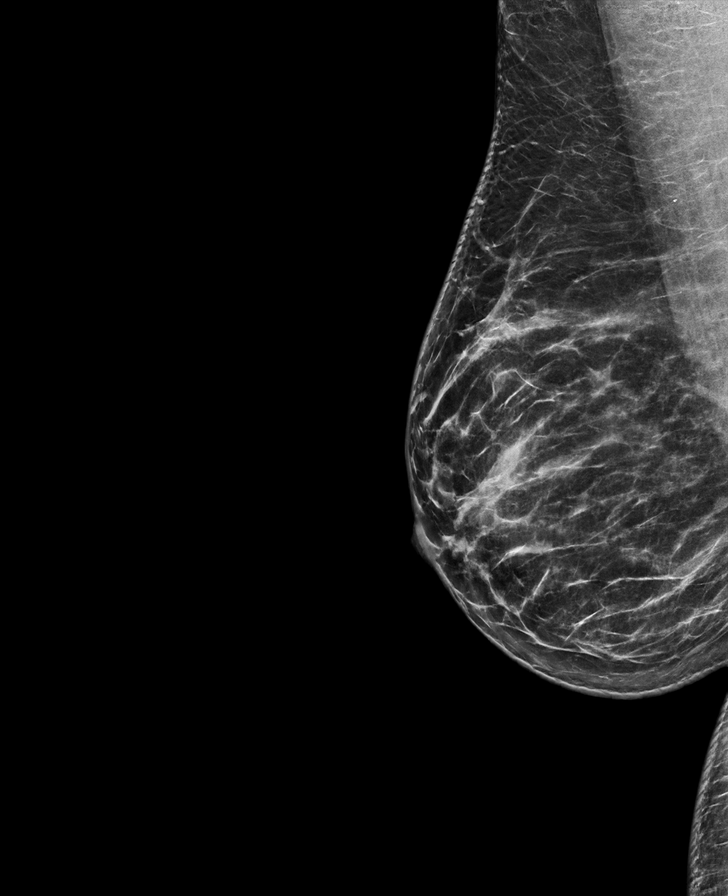

[L CC synth-2D]
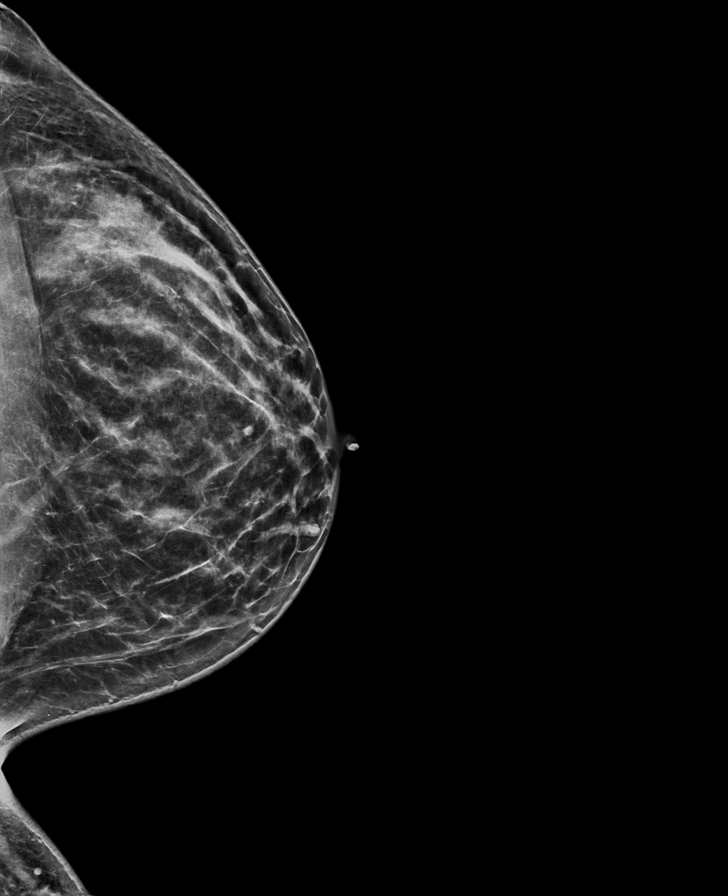

[R CC synth-2D]
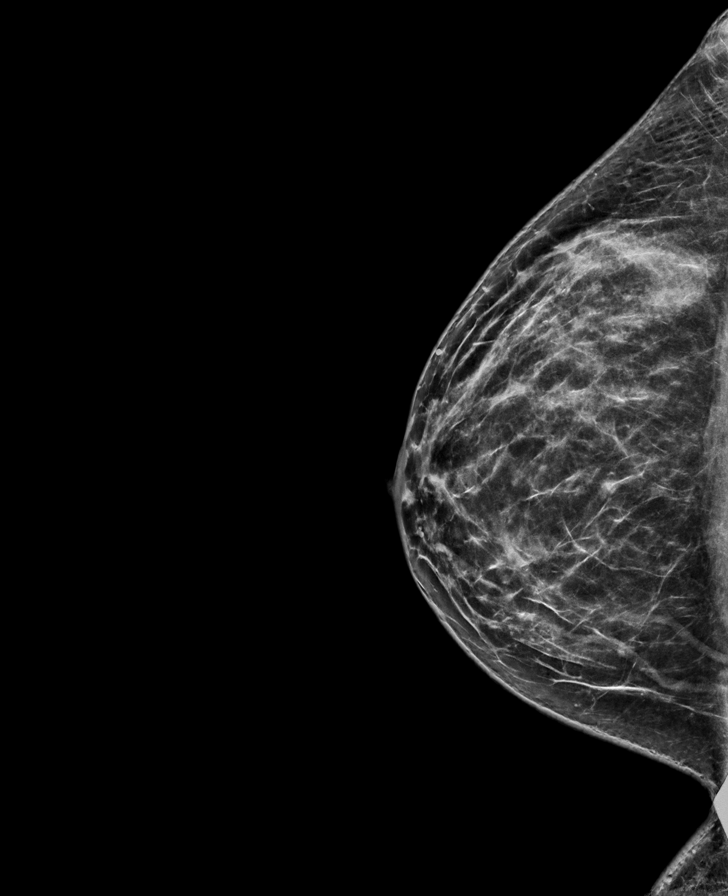

[L CC tomo · tomo slice 41/82.0]
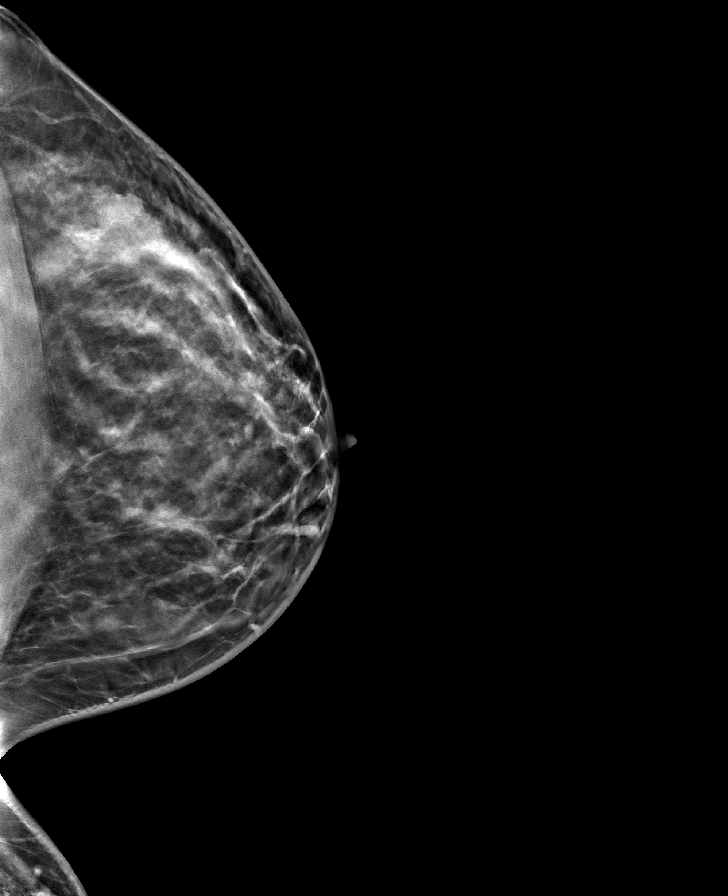

[R MLO tomo · tomo slice 41/80.0]
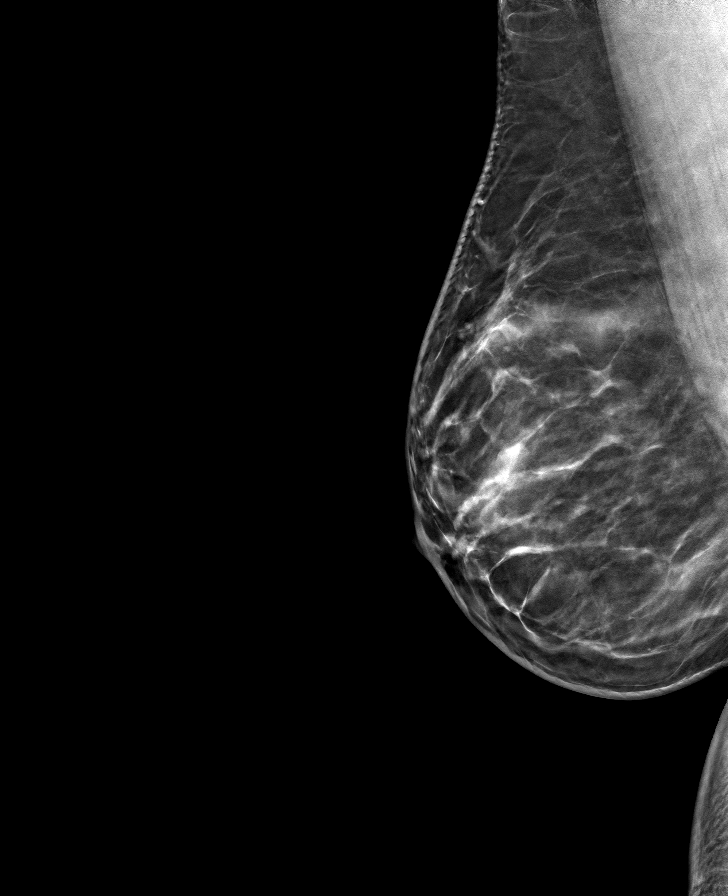

[L MLO tomo · tomo slice 42/83.0]
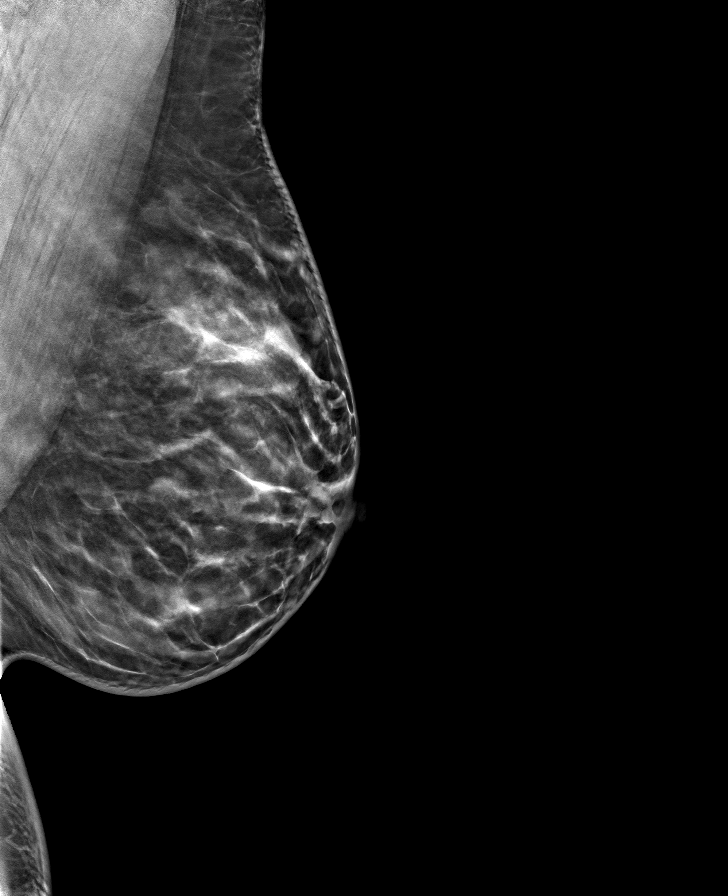

[R CC tomo · tomo slice 41/80.0]
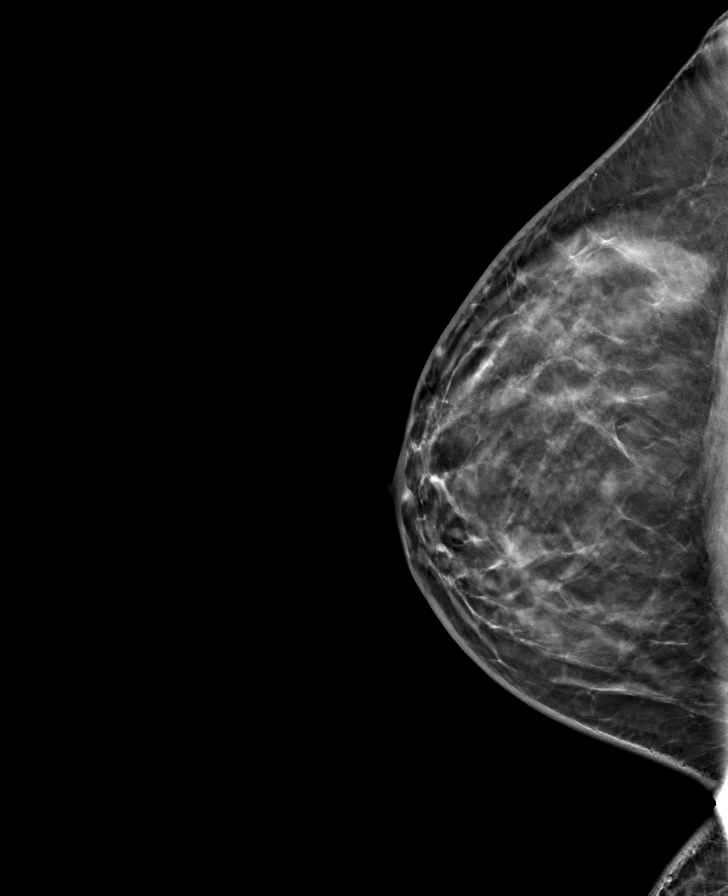

[8 of 24 positions shown; findings below may reference images not displayed]

ACR Breast Density Category c: The breast tissue is heterogeneously
dense, which may obscure small masses.
FINDINGS: In the left breast, a possible asymmetry warrants further
evaluation. In the right breast, no findings suspicious for
malignancy.
IMPRESSION: Further evaluation is suggested for possible asymmetry in the left
breast.

RECOMMENDATION:
Diagnostic mammogram and possibly ultrasound of the left breast.
(Code:8C-V-11X)

The patient will be contacted regarding the findings, and additional
imaging will be scheduled.

BI-RADS CATEGORY  0: Incomplete. Need additional imaging evaluation
and/or prior mammograms for comparison.

## 2024-03-07 ENCOUNTER — Other Ambulatory Visit: Payer: Self-pay | Admitting: Nurse Practitioner

## 2024-03-07 DIAGNOSIS — I10 Essential (primary) hypertension: Secondary | ICD-10-CM

## 2024-03-08 NOTE — Telephone Encounter (Signed)
 Requested medications are due for refill today.  yes  Requested medications are on the active medications list.  yes  Last refill. 12/09/2023 #90 0 rf  Future visit scheduled.   yes  Notes to clinic.  Labs are expired.    Requested Prescriptions  Pending Prescriptions Disp Refills   lisinopril  (ZESTRIL ) 5 MG tablet [Pharmacy Med Name: LISINOPRIL  5 MG TABLET] 90 tablet 0    Sig: TAKE 1 TABLET (5 MG TOTAL) BY MOUTH DAILY.     Cardiovascular:  ACE Inhibitors Failed - 03/08/2024  2:37 PM      Failed - Cr in normal range and within 180 days    Creatinine  Date Value Ref Range Status  01/07/2013 0.60 0.60 - 1.30 mg/dL Final   Creatinine, Ser  Date Value Ref Range Status  07/28/2023 0.76 0.57 - 1.00 mg/dL Final         Failed - K in normal range and within 180 days    Potassium  Date Value Ref Range Status  07/28/2023 4.2 3.5 - 5.2 mmol/L Final  01/07/2013 3.9 3.5 - 5.1 mmol/L Final         Passed - Patient is not pregnant      Passed - Last BP in normal range    BP Readings from Last 1 Encounters:  10/09/23 126/64         Passed - Valid encounter within last 6 months    Recent Outpatient Visits           5 months ago Primary hypertension   [facility] Gareth Mliss FALCON, FNP       Future Appointments             In 1 month Gareth, Mliss FALCON, FNP [facility] [place]

## 2024-04-19 ENCOUNTER — Ambulatory Visit: Admitting: Nurse Practitioner

## 2024-04-21 ENCOUNTER — Ambulatory Visit

## 2024-04-21 DIAGNOSIS — L578 Other skin changes due to chronic exposure to nonionizing radiation: Secondary | ICD-10-CM

## 2024-04-21 DIAGNOSIS — L821 Other seborrheic keratosis: Secondary | ICD-10-CM

## 2024-04-21 DIAGNOSIS — L814 Other melanin hyperpigmentation: Secondary | ICD-10-CM | POA: Diagnosis not present

## 2024-04-21 DIAGNOSIS — D485 Neoplasm of uncertain behavior of skin: Secondary | ICD-10-CM

## 2024-04-21 DIAGNOSIS — L820 Inflamed seborrheic keratosis: Secondary | ICD-10-CM

## 2024-04-21 NOTE — Progress Notes (Signed)
    Subjective   Courtney Ali is a 48 y.o. female who presents for the following: Lesion(s) of concern . Patient is new patient  Today patient reports: Patient states raised bump on left cheek, has been present for about a month. Suddenly appeared no progression. She uses Cerave moisturizer.  Review of Systems:    No other skin or systemic complaints except as noted in HPI or Assessment and Plan.  The following portions of the chart were reviewed this encounter and updated as appropriate: medications, allergies, medical history  Relevant Medical History:  n/a   Objective  (SKPE) Well appearing patient in no apparent distress; mood and affect are within normal limits. Examination was performed of the: Focused Exam of: face   Examination notable for: Lentigo/lentigines: Scattered pigmented macules that are tan to brown in color and are somewhat non-uniform in shape and concentrated in the sun-exposed areas, Seborrheic Keratosis(es): Stuck-on appearing keratotic papule(s) on the trunk, some  irritated with redness, crusting, edema, and/or partial avulsion, Actinic Damage/Elastosis: chronic sun damage: dyspigmentation, telangiectasia, and wrinkling -4 mm pink papule at left nasal bridge. Examination limited by: Undergarments, Shoes or socks , and Clothing     Assessment & Plan  (SKAP)   Benign Lesions/ Findings:  -Seborrheic Keratosis - Lentigines  - Reassurance provided regarding the benign appearance of lesions noted on exam today; no treatment is indicated in the absence of symptoms/changes. - Reinforced importance of photoprotective strategies including liberal and frequent sunscreen use of a broad-spectrum SPF 30 or greater, use of protective clothing, and sun avoidance for prevention of cutaneous malignancy and photoaging.  Counseled patient on the importance of regular self-skin monitoring as well as routine clinical skin examinations as scheduled.    Neoplasm of uncertain  behavior at left nasal bridge - Discussed obtaining biopsy today or observation. Patient prefers to observe and let us  know sooner if changing or growing and consider biopsy.   - favor nevus, fibrous papule vs less likely bcc    Level of service outlined above   Patient instructions (SKPI)   Procedures, orders, diagnosis for this visit:  INFLAMED SEBORRHEIC KERATOSIS L cheek x1 Destruction of lesion - L cheek x1 Complexity: simple   Destruction method: cryotherapy   Informed consent: discussed and consent obtained   Timeout:  patient name, date of birth, surgical site, and procedure verified Lesion destroyed using liquid nitrogen: Yes   Region frozen until ice ball extended beyond lesion: Yes   Cryo cycles: 1 or 2. Outcome: patient tolerated procedure well with no complications   Post-procedure details: wound care instructions given   Additional details:  Prior to procedure, discussed risks of blister formation, small wound, skin dyspigmentation, or rare scar following cryotherapy. Recommend Vaseline ointment to treated areas while healing.   LENTIGO   SEBORRHEIC KERATOSIS   ACTINIC SKIN DAMAGE    Inflamed seborrheic keratosis -     Destruction of lesion  Lentigo  Seborrheic keratosis  Actinic skin damage    Return to clinic: Return if symptoms worsen or fail to improve, for w/ Dr. Raymund.  I, Almetta Nora, RMA, am acting as scribe for Lauraine JAYSON Raymund, MD .   Documentation: I have reviewed the above documentation for accuracy and completeness, and I agree with the above.  Lauraine JAYSON Raymund, MD

## 2024-04-21 NOTE — Patient Instructions (Addendum)
 Cryosurgery  Cryosurgery ("freezing") uses liquid nitrogen to destroy certain types of skin lesions. Lowering the temperature of the lesion in a small area surrounding skin destroys the lesion. Immediately following cryosurgery, you will notice redness and swelling of the treatment area. Blistering or weeping may occur, lasting approximately one week which will then be followed by crusting. Most areas will heal completely in 10 to 14 days.  Wash the treated areas daily. Allow soap and water to run over the areas, but do not scrub. Should a scab or crust form, allow it to fall off on its own. Do not remove or pick at it. Application of an ointment  and a bandage may make you feel more comfortable, but it is not necessary. Some people develop an allergy to Neosporin, so we recommend that Vaseline or  Aquaphor be used.  The cryotherapy site will be more sensitive than your surrounding skin. Keep it covered, and remember to apply sunscreen every day to all your sun exposed skin. A scar may remain which is lighter or pinker than your normal skin. Your body will continue to improve your scar for up to one year; however a light-colored scar may remain.  Infection following cryotherapy is rare. However if you are worried about the appearance of the treated area, contact your doctor. We have a physician on call at all times. If you have any concerns about the site, please call our clinic at [phone]   Due to recent changes in healthcare laws, you may see results of your pathology and/or laboratory studies on MyChart before the doctors have had a chance to review them. We understand that in some cases there may be results that are confusing or concerning to you. Please understand that not all results are received at the same time and often the doctors may need to interpret multiple results in order to provide you with the best plan of care or course of treatment. Therefore, we ask that you please give us  2  business days to thoroughly review all your results before contacting the office for clarification. Should we see a critical lab result, you will be contacted sooner.   If You Need Anything After Your Visit  If you have any questions or concerns for your doctor, please call our main line at [phone] and press option 4 to reach your doctor's medical assistant. If no one answers, please leave a voicemail as directed and we will return your call as soon as possible. Messages left after 4 pm will be answered the following business day.   You may also send us  a message via MyChart. We typically respond to MyChart messages within 1-2 business days.  For prescription refills, please ask your pharmacy to contact our office. Our fax number is [phone].  If you have an urgent issue when the clinic is closed that cannot wait until the next business day, you can page your doctor at the number below.    Please note that while we do our best to be available for urgent issues outside of office hours, we are not available 24/7.   If you have an urgent issue and are unable to reach us , you may choose to seek medical care at your doctor's office, retail clinic, urgent care center, or emergency room.  If you have a medical emergency, please immediately call 911 or go to the emergency department.  Pager Numbers  - Dr. Hester: [phone]  - Dr. Jackquline: [phone]  - Dr. Claudene: [phone]   -  Dr. Raymund: [phone]  In the event of inclement weather, please call our main line at [phone] for an update on the status of any delays or closures.  Dermatology Medication Tips: Please keep the boxes that topical medications come in in order to help keep track of the instructions about where and how to use these. Pharmacies typically print the medication instructions only on the boxes and not directly on the medication tubes.   If your medication is too expensive, please contact our office  at [phone] option 4 or send us  a message through MyChart.   We are unable to tell what your co-pay for medications will be in advance as this is different depending on your insurance coverage. However, we may be able to find a substitute medication at lower cost or fill out paperwork to get insurance to cover a needed medication.   If a prior authorization is required to get your medication covered by your insurance company, please allow us  1-2 business days to complete this process.  Drug prices often vary depending on where the prescription is filled and some pharmacies may offer cheaper prices.  The website www.goodrx.com contains coupons for medications through different pharmacies. The prices here do not account for what the cost may be with help from insurance (it may be cheaper with your insurance), but the website can give you the price if you did not use any insurance.  - You can print the associated coupon and take it with your prescription to the pharmacy.  - You may also stop by our office during regular business hours and pick up a GoodRx coupon card.  - If you need your prescription sent electronically to a different pharmacy, notify our office through [facility] or by phone at [phone] option 4.     Si Usted Necesita Algo Despus de Su Visita  Tambin puede enviarnos un mensaje a travs de Clinical Cytogeneticist. Por lo general respondemos a los mensajes de MyChart en el transcurso de 1 a 2 das hbiles.  Para renovar recetas, por favor pida a su farmacia que se ponga en contacto con nuestra oficina. Randi lakes de fax es [place] [phone].  Si tiene un asunto urgente cuando la clnica est cerrada y que no puede esperar hasta el siguiente da hbil, puede llamar/localizar a su doctor(a) al nmero que aparece a continuacin.   Por favor, tenga en cuenta que aunque hacemos todo lo posible para estar disponibles para asuntos urgentes fuera del horario de [place], no estamos  disponibles las 24 horas del da, los 7 [address] de la [place].   Si tiene un problema urgente y no puede comunicarse con nosotros, puede optar por buscar atencin mdica  en el consultorio de su doctor(a), en una clnica privada, en un centro de atencin urgente o en una sala de emergencias.  Si tiene engineer, drilling, por favor llame inmediatamente al 911 o vaya a la sala de emergencias.  Nmeros de bper  - Dr. Hester: [phone]  - Dra. Jackquline: [phone]  - Dr. Claudene: [phone]  - Dra. Kitts: [phone]  En caso de inclemencias del [place], por favor llame a nuestra lnea principal al [phone] para una actualizacin sobre el estado de cualquier retraso o cierre.  Consejos para la medicacin en dermatologa: Por favor, guarde las cajas en las que vienen los medicamentos de uso tpico para ayudarle a seguir las instrucciones sobre dnde y cmo usarlos. Las farmacias generalmente imprimen las instrucciones del medicamento slo en las cajas y  no directamente en los tubos del medicamento.   Si su medicamento es muy caro, por favor, pngase en contacto con landry rieger llamando al [phone] y presione la opcin 4 o envenos un mensaje a travs de Clinical Cytogeneticist.   No podemos decirle cul ser su copago por los medicamentos por adelantado ya que esto es diferente dependiendo de la cobertura de su seguro. Sin embargo, es posible que podamos encontrar un medicamento sustituto a audiological scientist un formulario para que el seguro cubra el medicamento que se considera necesario.   Si se requiere una autorizacin previa para que su compaa de seguros cubra su medicamento, por favor permtanos de 1 a 2 das hbiles para completar este proceso.  Los precios de los medicamentos varan con frecuencia dependiendo del environmental consultant de dnde se surte la receta y alguna farmacias pueden ofrecer precios ms baratos.  El sitio web www.goodrx.com tiene cupones para medicamentos de engineer, civil (consulting). Los precios aqu no tienen en cuenta lo que podra costar con la ayuda del seguro (puede ser ms barato con su seguro), pero el sitio web puede darle el precio si no utiliz tourist information centre manager.  - Puede imprimir el cupn correspondiente y llevarlo con su receta a la farmacia.  - Tambin puede pasar por nuestra oficina durante el horario de atencin regular y education officer, museum una tarjeta de cupones de GoodRx.  - Si necesita que su receta se enve electrnicamente a una farmacia diferente, informe a nuestra oficina a travs de MyChart de  o por telfono llamando al [phone] y presione la opcin 4.

## 2024-06-09 ENCOUNTER — Other Ambulatory Visit: Payer: Self-pay | Admitting: Nurse Practitioner

## 2024-06-09 DIAGNOSIS — I10 Essential (primary) hypertension: Secondary | ICD-10-CM

## 2024-06-10 ENCOUNTER — Other Ambulatory Visit: Payer: Self-pay | Admitting: Nurse Practitioner

## 2024-06-10 ENCOUNTER — Encounter: Payer: Self-pay | Admitting: Nurse Practitioner

## 2024-06-10 DIAGNOSIS — I10 Essential (primary) hypertension: Secondary | ICD-10-CM

## 2024-06-10 NOTE — Telephone Encounter (Signed)
 Copied from CRM [id number]. Topic: Clinical - Prescription Issue >> Jun 10, 2024 10:51 AM Wess RAMAN wrote: Reason for CRM: Patient states she will be out in 5 days of lisinopril  (ZESTRIL ) 5 MG tablet.  Callback #: [id number]  Pharmacy: [id number] [address], [facility] - [address] [place] [address] Phone: [phone] Fax: [phone] Hours: Not open 24 hours

## 2024-06-10 NOTE — Telephone Encounter (Signed)
 Duplicate request, refilled 06/10/24.  Requested Prescriptions  Pending Prescriptions Disp Refills   lisinopril  (ZESTRIL ) 5 MG tablet 90 tablet 0    Sig: Take 1 tablet (5 mg total) by mouth daily.     Cardiovascular:  ACE Inhibitors Failed - 06/10/2024  2:00 PM      Failed - Cr in normal range and within 180 days    Creatinine  Date Value Ref Range Status  01/07/2013 0.60 0.60 - 1.30 mg/dL Final   Creatinine, Ser  Date Value Ref Range Status  07/28/2023 0.76 0.57 - 1.00 mg/dL Final         Failed - K in normal range and within 180 days    Potassium  Date Value Ref Range Status  07/28/2023 4.2 3.5 - 5.2 mmol/L Final  01/07/2013 3.9 3.5 - 5.1 mmol/L Final         Failed - Valid encounter within last 6 months    Recent Outpatient Visits           8 months ago Primary hypertension   [facility] Gareth Mliss [place], [place]              Passed - Patient is not pregnant      Passed - Last BP in normal range    BP Readings from Last 1 Encounters:  10/09/23 126/64

## 2024-06-10 NOTE — Telephone Encounter (Signed)
 Requested medications are due for refill today.  yes  Requested medications are on the active medications list.  yes  Last refill. 03/08/2024 #90 0 rf  Future visit scheduled.   no  Notes to clinic.  Expired labs    Requested Prescriptions  Pending Prescriptions Disp Refills   lisinopril  (ZESTRIL ) 5 MG tablet [Pharmacy Med Name: LISINOPRIL  5 MG TABLET] 90 tablet 0    Sig: TAKE 1 TABLET (5 MG TOTAL) BY MOUTH DAILY.     Cardiovascular:  ACE Inhibitors Failed - 06/10/2024 11:20 AM      Failed - Cr in normal range and within 180 days    Creatinine  Date Value Ref Range Status  01/07/2013 0.60 0.60 - 1.30 mg/dL Final   Creatinine, Ser  Date Value Ref Range Status  07/28/2023 0.76 0.57 - 1.00 mg/dL Final         Failed - K in normal range and within 180 days    Potassium  Date Value Ref Range Status  07/28/2023 4.2 3.5 - 5.2 mmol/L Final  01/07/2013 3.9 3.5 - 5.1 mmol/L Final         Failed - Valid encounter within last 6 months    Recent Outpatient Visits           8 months ago Primary hypertension   [facility] Gareth Mliss [place]              Passed - Patient is not pregnant      Passed - Last BP in normal range    BP Readings from Last 1 Encounters:  10/09/23 126/64

## 2024-06-10 NOTE — Telephone Encounter (Signed)
 Duplicate request- filled 06/10/24 #30- needs appointment Requested Prescriptions  Pending Prescriptions Disp Refills   lisinopril  (ZESTRIL ) 5 MG tablet [Pharmacy Med Name: LISINOPRIL  5 MG TABLET] 30 tablet 0    Sig: TAKE 1 TABLET (5 MG TOTAL) BY MOUTH DAILY.     Cardiovascular:  ACE Inhibitors Failed - 06/10/2024  2:41 PM      Failed - Cr in normal range and within 180 days    Creatinine  Date Value Ref Range Status  01/07/2013 0.60 0.60 - 1.30 mg/dL Final   Creatinine, Ser  Date Value Ref Range Status  07/28/2023 0.76 0.57 - 1.00 mg/dL Final         Failed - K in normal range and within 180 days    Potassium  Date Value Ref Range Status  07/28/2023 4.2 3.5 - 5.2 mmol/L Final  01/07/2013 3.9 3.5 - 5.1 mmol/L Final         Failed - Valid encounter within last 6 months    Recent Outpatient Visits           8 months ago Primary hypertension   [facility] [place]areth Mliss [place]              Passed - Patient is not pregnant      Passed - Last BP in normal range    BP Readings from Last 1 Encounters:  10/09/23 126/64

## 2024-06-21 ENCOUNTER — Ambulatory Visit (INDEPENDENT_AMBULATORY_CARE_PROVIDER_SITE_OTHER): Admitting: Nurse Practitioner

## 2024-06-21 ENCOUNTER — Encounter: Payer: Self-pay | Admitting: Nurse Practitioner

## 2024-06-21 VITALS — BP 138/104 | HR 107 | Temp 97.8°F | Ht 68.0 in | Wt 230.0 lb

## 2024-06-21 DIAGNOSIS — Z131 Encounter for screening for diabetes mellitus: Secondary | ICD-10-CM | POA: Diagnosis not present

## 2024-06-21 DIAGNOSIS — E785 Hyperlipidemia, unspecified: Secondary | ICD-10-CM | POA: Diagnosis not present

## 2024-06-21 DIAGNOSIS — Z1231 Encounter for screening mammogram for malignant neoplasm of breast: Secondary | ICD-10-CM

## 2024-06-21 DIAGNOSIS — Z1211 Encounter for screening for malignant neoplasm of colon: Secondary | ICD-10-CM

## 2024-06-21 DIAGNOSIS — Z130 Encounter for screening for diseases of the blood and blood-forming organs and certain disorders involving the immune mechanism: Secondary | ICD-10-CM | POA: Diagnosis not present

## 2024-06-21 DIAGNOSIS — K219 Gastro-esophageal reflux disease without esophagitis: Secondary | ICD-10-CM | POA: Diagnosis not present

## 2024-06-21 DIAGNOSIS — I10 Essential (primary) hypertension: Secondary | ICD-10-CM | POA: Diagnosis not present

## 2024-06-21 MED ORDER — LISINOPRIL 5 MG PO TABS: 5.0000 mg | ORAL_TABLET | Freq: Every day | ORAL | 1 refills | Status: AC

## 2024-06-21 MED ORDER — OMEPRAZOLE 20 MG PO CPDR: 20.0000 mg | DELAYED_RELEASE_CAPSULE | Freq: Every day | ORAL | 1 refills | Status: AC

## 2024-06-22 ENCOUNTER — Ambulatory Visit: Payer: Self-pay | Admitting: Nurse Practitioner

## 2024-06-22 LAB — CBC WITH DIFFERENTIAL/PLATELET
Absolute Lymphocytes: 2322 {cells}/uL (ref 850–3900)
Absolute Monocytes: 649 {cells}/uL (ref 200–950)
Basophils Absolute: 75 {cells}/uL (ref 0–200)
Basophils Relative: 0.8 %
Eosinophils Absolute: 348 {cells}/uL (ref 15–500)
Eosinophils Relative: 3.7 %
HCT: 44.3 % (ref 35.9–46.0)
Hemoglobin: 15.2 g/dL (ref 11.7–15.5)
MCH: 30.4 pg (ref 27.0–33.0)
MCHC: 34.3 g/dL (ref 31.6–35.4)
MCV: 88.6 fL (ref 81.4–101.7)
MPV: 10.3 fL (ref 7.5–12.5)
Monocytes Relative: 6.9 %
Neutro Abs: 6007 {cells}/uL (ref 1500–7800)
Neutrophils Relative %: 63.9 %
Platelets: 284 10*3/uL (ref 140–400)
RBC: 5 Million/uL (ref 3.80–5.10)
RDW: 12.3 % (ref 11.0–15.0)
Total Lymphocyte: 24.7 %
WBC: 9.4 10*3/uL (ref 3.8–10.8)

## 2024-06-22 LAB — LIPID PANEL
Cholesterol: 217 mg/dL — ABNORMAL HIGH
HDL: 50 mg/dL
LDL Cholesterol (Calc): 141 mg/dL — ABNORMAL HIGH
Non-HDL Cholesterol (Calc): 167 mg/dL — ABNORMAL HIGH
Total CHOL/HDL Ratio: 4.3 (calc)
Triglycerides: 131 mg/dL

## 2024-06-22 LAB — COMPREHENSIVE METABOLIC PANEL WITH GFR
AG Ratio: 2 (calc) (ref 1.0–2.5)
ALT: 32 U/L — ABNORMAL HIGH (ref 6–29)
AST: 21 U/L (ref 10–35)
Albumin: 4.5 g/dL (ref 3.6–5.1)
Alkaline phosphatase (APISO): 79 U/L (ref 31–125)
BUN: 8 mg/dL (ref 7–25)
CO2: 28 mmol/L (ref 20–32)
Calcium: 10.4 mg/dL — ABNORMAL HIGH (ref 8.6–10.2)
Chloride: 105 mmol/L (ref 98–110)
Creat: 0.82 mg/dL (ref 0.50–0.99)
Globulin: 2.2 g/dL (ref 1.9–3.7)
Glucose, Bld: 90 mg/dL (ref 65–99)
Potassium: 4.6 mmol/L (ref 3.5–5.3)
Sodium: 141 mmol/L (ref 135–146)
Total Bilirubin: 0.6 mg/dL (ref 0.2–1.2)
Total Protein: 6.7 g/dL (ref 6.1–8.1)
eGFR: 89 mL/min/{1.73_m2}

## 2024-06-22 LAB — HEMOGLOBIN A1C
Hgb A1c MFr Bld: 5.4 %
Mean Plasma Glucose: 108 mg/dL
eAG (mmol/L): 6 mmol/L

## 2024-08-23 ENCOUNTER — Encounter

## 2024-12-20 ENCOUNTER — Encounter: Admitting: Nurse Practitioner
